# Patient Record
Sex: Female | Born: 1965 | Race: White | Hispanic: No | Marital: Married | Smoking: Never smoker
Health system: Southern US, Community
[De-identification: ages and names within clinical notes are randomized; demographics above are authoritative.]

## PROBLEM LIST (undated history)

## (undated) DIAGNOSIS — S83282A Other tear of lateral meniscus, current injury, left knee, initial encounter: Secondary | ICD-10-CM

## (undated) DIAGNOSIS — R457 State of emotional shock and stress, unspecified: Secondary | ICD-10-CM

## (undated) DIAGNOSIS — M549 Dorsalgia, unspecified: Secondary | ICD-10-CM

## (undated) DIAGNOSIS — L509 Urticaria, unspecified: Secondary | ICD-10-CM

## (undated) DIAGNOSIS — Z91018 Allergy to other foods: Secondary | ICD-10-CM

## (undated) DIAGNOSIS — Z789 Other specified health status: Secondary | ICD-10-CM

## (undated) DIAGNOSIS — T7840XA Allergy, unspecified, initial encounter: Secondary | ICD-10-CM

## (undated) DIAGNOSIS — M2550 Pain in unspecified joint: Secondary | ICD-10-CM

## (undated) HISTORY — DX: Allergy to other foods: Z91.018

## (undated) HISTORY — DX: Other tear of lateral meniscus, current injury, left knee, initial encounter: S83.282A

## (undated) HISTORY — DX: Allergy, unspecified, initial encounter: T78.40XA

## (undated) HISTORY — DX: State of emotional shock and stress, unspecified: R45.7

## (undated) HISTORY — DX: Dorsalgia, unspecified: M54.9

## (undated) HISTORY — DX: Urticaria, unspecified: L50.9

## (undated) HISTORY — DX: Pain in unspecified joint: M25.50

## (undated) HISTORY — PX: OTHER SURGICAL HISTORY: SHX169

## (undated) HISTORY — DX: Other specified health status: Z78.9

---

## 1998-07-28 ENCOUNTER — Other Ambulatory Visit: Admission: RE | Admit: 1998-07-28 | Discharge: 1998-07-28 | Payer: Self-pay | Admitting: Gynecology

## 1999-02-20 ENCOUNTER — Inpatient Hospital Stay (HOSPITAL_COMMUNITY): Admission: AD | Admit: 1999-02-20 | Discharge: 1999-02-23 | Payer: Self-pay | Admitting: Gynecology

## 1999-02-20 ENCOUNTER — Encounter (INDEPENDENT_AMBULATORY_CARE_PROVIDER_SITE_OTHER): Payer: Self-pay | Admitting: Specialist

## 1999-02-24 ENCOUNTER — Encounter: Admission: RE | Admit: 1999-02-24 | Discharge: 1999-05-25 | Payer: Self-pay | Admitting: Gynecology

## 1999-04-01 ENCOUNTER — Other Ambulatory Visit: Admission: RE | Admit: 1999-04-01 | Discharge: 1999-04-01 | Payer: Self-pay | Admitting: Gynecology

## 2000-05-02 ENCOUNTER — Other Ambulatory Visit: Admission: RE | Admit: 2000-05-02 | Discharge: 2000-05-02 | Payer: Self-pay | Admitting: Gynecology

## 2001-10-16 ENCOUNTER — Other Ambulatory Visit: Admission: RE | Admit: 2001-10-16 | Discharge: 2001-10-16 | Payer: Self-pay | Admitting: Gynecology

## 2002-06-06 ENCOUNTER — Encounter: Admission: RE | Admit: 2002-06-06 | Discharge: 2002-06-06 | Payer: Self-pay | Admitting: Gynecology

## 2002-06-06 ENCOUNTER — Encounter: Payer: Self-pay | Admitting: Gynecology

## 2002-11-17 ENCOUNTER — Other Ambulatory Visit: Admission: RE | Admit: 2002-11-17 | Discharge: 2002-11-17 | Payer: Self-pay | Admitting: Gynecology

## 2003-09-01 ENCOUNTER — Encounter: Admission: RE | Admit: 2003-09-01 | Discharge: 2003-09-01 | Payer: Self-pay | Admitting: Family Medicine

## 2004-01-14 ENCOUNTER — Other Ambulatory Visit: Admission: RE | Admit: 2004-01-14 | Discharge: 2004-01-14 | Payer: Self-pay | Admitting: Gynecology

## 2005-03-16 ENCOUNTER — Other Ambulatory Visit: Admission: RE | Admit: 2005-03-16 | Discharge: 2005-03-16 | Payer: Self-pay | Admitting: Gynecology

## 2006-03-01 ENCOUNTER — Encounter: Admission: RE | Admit: 2006-03-01 | Discharge: 2006-03-01 | Payer: Self-pay | Admitting: Gynecology

## 2006-03-19 ENCOUNTER — Other Ambulatory Visit: Admission: RE | Admit: 2006-03-19 | Discharge: 2006-03-19 | Payer: Self-pay | Admitting: Gynecology

## 2006-05-08 HISTORY — PX: GYNECOLOGIC CRYOSURGERY: SHX857

## 2007-01-11 ENCOUNTER — Other Ambulatory Visit: Admission: RE | Admit: 2007-01-11 | Discharge: 2007-01-11 | Payer: Self-pay | Admitting: Gynecology

## 2007-04-10 ENCOUNTER — Other Ambulatory Visit: Admission: RE | Admit: 2007-04-10 | Discharge: 2007-04-10 | Payer: Self-pay | Admitting: Gynecology

## 2007-05-08 ENCOUNTER — Encounter: Admission: RE | Admit: 2007-05-08 | Discharge: 2007-05-08 | Payer: Self-pay | Admitting: Gynecology

## 2007-05-15 ENCOUNTER — Encounter: Admission: RE | Admit: 2007-05-15 | Discharge: 2007-05-15 | Payer: Self-pay | Admitting: Gynecology

## 2007-11-11 ENCOUNTER — Encounter: Admission: RE | Admit: 2007-11-11 | Discharge: 2007-11-11 | Payer: Self-pay | Admitting: Gynecology

## 2007-12-25 ENCOUNTER — Other Ambulatory Visit: Admission: RE | Admit: 2007-12-25 | Discharge: 2007-12-25 | Payer: Self-pay | Admitting: Gynecology

## 2008-04-10 ENCOUNTER — Ambulatory Visit: Payer: Self-pay | Admitting: Women's Health

## 2008-04-10 ENCOUNTER — Encounter: Payer: Self-pay | Admitting: Women's Health

## 2008-04-10 ENCOUNTER — Other Ambulatory Visit: Admission: RE | Admit: 2008-04-10 | Discharge: 2008-04-10 | Payer: Self-pay | Admitting: Gynecology

## 2008-09-30 ENCOUNTER — Encounter: Admission: RE | Admit: 2008-09-30 | Discharge: 2008-09-30 | Payer: Self-pay | Admitting: Gynecology

## 2008-11-19 ENCOUNTER — Ambulatory Visit: Payer: Self-pay | Admitting: Women's Health

## 2009-01-21 ENCOUNTER — Ambulatory Visit: Payer: Self-pay | Admitting: Women's Health

## 2009-05-10 ENCOUNTER — Ambulatory Visit: Payer: Self-pay | Admitting: Women's Health

## 2009-05-10 ENCOUNTER — Other Ambulatory Visit: Admission: RE | Admit: 2009-05-10 | Discharge: 2009-05-10 | Payer: Self-pay | Admitting: Gynecology

## 2009-05-28 ENCOUNTER — Other Ambulatory Visit: Admission: RE | Admit: 2009-05-28 | Discharge: 2009-05-28 | Payer: Self-pay | Admitting: Gynecology

## 2009-05-28 ENCOUNTER — Ambulatory Visit: Payer: Self-pay | Admitting: Women's Health

## 2009-10-29 HISTORY — PX: MENISCUS REPAIR: SHX5179

## 2009-11-22 ENCOUNTER — Ambulatory Visit (HOSPITAL_BASED_OUTPATIENT_CLINIC_OR_DEPARTMENT_OTHER): Admission: RE | Admit: 2009-11-22 | Discharge: 2009-11-22 | Payer: Self-pay | Admitting: Specialist

## 2009-12-20 ENCOUNTER — Encounter: Admission: RE | Admit: 2009-12-20 | Discharge: 2009-12-20 | Payer: Self-pay | Admitting: Gynecology

## 2010-05-01 DIAGNOSIS — L509 Urticaria, unspecified: Secondary | ICD-10-CM

## 2010-05-01 HISTORY — DX: Urticaria, unspecified: L50.9

## 2010-05-11 ENCOUNTER — Ambulatory Visit
Admission: RE | Admit: 2010-05-11 | Discharge: 2010-05-11 | Payer: Self-pay | Source: Home / Self Care | Attending: Women's Health | Admitting: Women's Health

## 2010-05-11 ENCOUNTER — Other Ambulatory Visit
Admission: RE | Admit: 2010-05-11 | Discharge: 2010-05-11 | Payer: Self-pay | Source: Home / Self Care | Admitting: Gynecology

## 2010-07-16 LAB — POCT PREGNANCY, URINE

## 2010-07-16 LAB — POCT HEMOGLOBIN-HEMACUE: Hemoglobin: 12.9 g/dL (ref 12.0–15.0)

## 2010-12-30 ENCOUNTER — Other Ambulatory Visit: Payer: Self-pay | Admitting: Specialist

## 2010-12-30 ENCOUNTER — Encounter (HOSPITAL_COMMUNITY)
Admission: RE | Admit: 2010-12-30 | Discharge: 2010-12-30 | Disposition: A | Discharge status: Home / Self Care | Payer: BC Managed Care – PPO | Source: Ambulatory Visit | Attending: Specialist | Admitting: Specialist

## 2010-12-30 DIAGNOSIS — Z01812 Encounter for preprocedural laboratory examination: Secondary | ICD-10-CM | POA: Insufficient documentation

## 2010-12-30 DIAGNOSIS — Z01811 Encounter for preprocedural respiratory examination: Secondary | ICD-10-CM | POA: Insufficient documentation

## 2010-12-30 LAB — SURGICAL PCR SCREEN: Staphylococcus aureus: POSITIVE — AB

## 2011-01-05 ENCOUNTER — Ambulatory Visit (HOSPITAL_COMMUNITY): Admission: RE | Admit: 2011-01-05 | Payer: BC Managed Care – PPO | Source: Ambulatory Visit | Admitting: Specialist

## 2011-01-19 ENCOUNTER — Other Ambulatory Visit: Payer: Self-pay | Admitting: Obstetrics and Gynecology

## 2011-01-19 DIAGNOSIS — Z1231 Encounter for screening mammogram for malignant neoplasm of breast: Secondary | ICD-10-CM

## 2011-01-31 ENCOUNTER — Ambulatory Visit: Payer: BC Managed Care – PPO

## 2011-02-01 ENCOUNTER — Ambulatory Visit
Admission: RE | Admit: 2011-02-01 | Discharge: 2011-02-01 | Disposition: A | Discharge status: Home / Self Care | Payer: BC Managed Care – PPO | Source: Ambulatory Visit | Attending: Obstetrics and Gynecology | Admitting: Obstetrics and Gynecology

## 2011-02-01 DIAGNOSIS — Z1231 Encounter for screening mammogram for malignant neoplasm of breast: Secondary | ICD-10-CM

## 2011-05-23 ENCOUNTER — Other Ambulatory Visit: Payer: Self-pay | Admitting: Women's Health

## 2011-05-25 ENCOUNTER — Encounter: Payer: Self-pay | Admitting: Women's Health

## 2011-05-25 ENCOUNTER — Ambulatory Visit (INDEPENDENT_AMBULATORY_CARE_PROVIDER_SITE_OTHER): Payer: BC Managed Care – PPO | Admitting: Women's Health

## 2011-05-25 ENCOUNTER — Other Ambulatory Visit (HOSPITAL_COMMUNITY)
Admission: RE | Admit: 2011-05-25 | Discharge: 2011-05-25 | Disposition: A | Discharge status: Home / Self Care | Payer: BC Managed Care – PPO | Source: Ambulatory Visit | Attending: Obstetrics and Gynecology | Admitting: Obstetrics and Gynecology

## 2011-05-25 VITALS — BP 110/72 | Ht 63.75 in | Wt 160.0 lb

## 2011-05-25 DIAGNOSIS — Z309 Encounter for contraceptive management, unspecified: Secondary | ICD-10-CM

## 2011-05-25 DIAGNOSIS — Z01419 Encounter for gynecological examination (general) (routine) without abnormal findings: Secondary | ICD-10-CM | POA: Insufficient documentation

## 2011-05-25 DIAGNOSIS — E079 Disorder of thyroid, unspecified: Secondary | ICD-10-CM

## 2011-05-25 DIAGNOSIS — IMO0001 Reserved for inherently not codable concepts without codable children: Secondary | ICD-10-CM

## 2011-05-25 LAB — URINALYSIS, ROUTINE W REFLEX MICROSCOPIC
Bilirubin Urine: NEGATIVE
Glucose, UA: NEGATIVE mg/dL
Hgb urine dipstick: NEGATIVE
Ketones, ur: NEGATIVE mg/dL
Protein, ur: NEGATIVE mg/dL

## 2011-05-25 MED ORDER — NORETHINDRONE ACET-ETHINYL EST 1-20 MG-MCG PO TABS: 1.0000 | ORAL_TABLET | Freq: Every day | ORAL | Status: DC

## 2011-05-25 NOTE — Progress Notes (Signed)
Betty Price 17-Aug-1965 [id number]    History:    The patient presents for annual exam.  Contracepting on Loestrin 1/20 will occasionally skip a period without any missed pills. History of LGSIL/CIN-1 with cryo- in 2008 with normal Paps after. History of normal mammograms.   Past medical history, past surgical history, family history and social history were all reviewed and documented in the EPIC chart. Has had a problem with hives with unknown cause, less often this past year. Daughter Betty Price 12, doing well   ROS:  A  ROS was performed and pertinent positives and negatives are included in the history.  Exam:  Filed Vitals:   05/25/11 0919  BP: 110/72    General appearance:  Normal Head/Neck:  Normal, without cervical or supraclavicular adenopathy. Thyroid:  Symmetrical, normal in size, without palpable masses or nodularity. Respiratory  Effort:  Normal  Auscultation:  Clear without wheezing or rhonchi Cardiovascular  Auscultation:  Regular rate, without rubs, murmurs or gallops  Edema/varicosities:  Not grossly evident Abdominal  Soft,nontender, without masses, guarding or rebound.  Liver/spleen:  No organomegaly noted  Hernia:  None appreciated  Skin  Inspection:  Grossly normal  Palpation:  Grossly normal Neurologic/psychiatric  Orientation:  Normal with appropriate conversation.  Mood/affect:  Normal  Genitourinary    Breasts: Examined lying and sitting.     Right: Without masses, retractions, discharge or axillary adenopathy.     Left: Without masses, retractions, discharge or axillary adenopathy.   Inguinal/mons:  Normal without inguinal adenopathy  External genitalia:  Normal  BUS/Urethra/Skene's glands:  Normal  Bladder:  Normal  Vagina:  Normal  Cervix:  Normal  Uterus:   normal in size, shape and contour.  Midline and mobile  Adnexa/parametria:     Rt: Without masses or tenderness.   Lt: Without masses or tenderness.  Anus and perineum: Normal  Digital  rectal exam: Normal sphincter tone without palpated masses or tenderness  Assessment/Plan:  46 y.o. MWF G1 P1 for annual exam with no complaints  Normal GYN exam on Loestrin 1/20 with occasional missed cycles History of CIN-1/cryo- in 08 normal Paps after  Plan: Loestrin prescription, proper use, slight risk for blood clots and strokes reviewed. Reviewed importance of continuing pills with missed or light cycles. Reviewed importance of checking U PT if missed pills. SBEs, annual mammogram, exercise, calcium rich diet, MVI daily encouraged. CBC, TSH, UA and Pap.    Harrington Challenger [facility], 1:37 PM 05/25/2011

## 2011-05-26 LAB — CBC WITH DIFFERENTIAL/PLATELET
Basophils Absolute: 0.1 10*3/uL (ref 0.0–0.1)
Basophils Relative: 2 % — ABNORMAL HIGH (ref 0–1)
Eosinophils Absolute: 0.2 10*3/uL (ref 0.0–0.7)
Eosinophils Relative: 3 % (ref 0–5)
HCT: 39.6 % (ref 36.0–46.0)
MCHC: 33.8 g/dL (ref 30.0–36.0)
Monocytes Absolute: 0.4 10*3/uL (ref 0.1–1.0)
Neutro Abs: 3 10*3/uL (ref 1.7–7.7)
RDW: 13.2 % (ref 11.5–15.5)

## 2011-10-02 ENCOUNTER — Telehealth: Payer: Self-pay | Admitting: *Deleted

## 2011-10-02 DIAGNOSIS — Z8349 Family history of other endocrine, nutritional and metabolic diseases: Secondary | ICD-10-CM

## 2011-10-02 NOTE — Telephone Encounter (Signed)
Ok please call pt.,  have her make lab appointment for Thyroid panel.

## 2011-10-02 NOTE — Telephone Encounter (Signed)
Pt called requesting if she could have a full thyroid panel lab drawn, her sister has had thyroid problems. Call back number if needed [phone]. Please advise

## 2011-10-03 NOTE — Telephone Encounter (Signed)
Left message on pt voicemail with the below, orders placed.

## 2011-10-04 ENCOUNTER — Other Ambulatory Visit: Payer: BC Managed Care – PPO

## 2011-10-04 DIAGNOSIS — Z8349 Family history of other endocrine, nutritional and metabolic diseases: Secondary | ICD-10-CM

## 2011-10-05 LAB — THYROID ANTIBODIES
Thyroglobulin Ab: <20 U/mL (ref <40.0)
Thyroperoxidase Ab SerPl-aCnc: <10 IU/mL (ref <35.0)

## 2012-03-25 ENCOUNTER — Other Ambulatory Visit: Payer: Self-pay | Admitting: Obstetrics and Gynecology

## 2012-03-25 DIAGNOSIS — Z1231 Encounter for screening mammogram for malignant neoplasm of breast: Secondary | ICD-10-CM

## 2012-03-27 ENCOUNTER — Ambulatory Visit
Admission: RE | Admit: 2012-03-27 | Discharge: 2012-03-27 | Disposition: A | Discharge status: Home / Self Care | Payer: BC Managed Care – PPO | Source: Ambulatory Visit | Attending: Obstetrics and Gynecology | Admitting: Obstetrics and Gynecology

## 2012-03-27 DIAGNOSIS — Z1231 Encounter for screening mammogram for malignant neoplasm of breast: Secondary | ICD-10-CM

## 2012-05-29 ENCOUNTER — Encounter: Payer: BC Managed Care – PPO | Admitting: Women's Health

## 2012-06-10 ENCOUNTER — Ambulatory Visit (INDEPENDENT_AMBULATORY_CARE_PROVIDER_SITE_OTHER): Payer: BC Managed Care – PPO | Admitting: Women's Health

## 2012-06-10 ENCOUNTER — Encounter: Payer: Self-pay | Admitting: Women's Health

## 2012-06-10 VITALS — BP 110/62 | Ht 64.0 in | Wt 157.0 lb

## 2012-06-10 DIAGNOSIS — Z01419 Encounter for gynecological examination (general) (routine) without abnormal findings: Secondary | ICD-10-CM

## 2012-06-10 DIAGNOSIS — Z13220 Encounter for screening for lipoid disorders: Secondary | ICD-10-CM

## 2012-06-10 DIAGNOSIS — N879 Dysplasia of cervix uteri, unspecified: Secondary | ICD-10-CM

## 2012-06-10 DIAGNOSIS — E079 Disorder of thyroid, unspecified: Secondary | ICD-10-CM

## 2012-06-10 DIAGNOSIS — Z833 Family history of diabetes mellitus: Secondary | ICD-10-CM

## 2012-06-10 DIAGNOSIS — IMO0001 Reserved for inherently not codable concepts without codable children: Secondary | ICD-10-CM

## 2012-06-10 DIAGNOSIS — Z309 Encounter for contraceptive management, unspecified: Secondary | ICD-10-CM

## 2012-06-10 LAB — CBC WITH DIFFERENTIAL/PLATELET
Hemoglobin: 13.1 g/dL (ref 12.0–15.0)
Lymphs Abs: 1.7 10*3/uL (ref 0.7–4.0)
Monocytes Relative: 6 % (ref 3–12)
Neutro Abs: 4 10*3/uL (ref 1.7–7.7)
Neutrophils Relative %: 63 % (ref 43–77)
Platelets: 335 10*3/uL (ref 150–400)
RBC: 4.09 MIL/uL (ref 3.87–5.11)
WBC: 6.3 10*3/uL (ref 4.0–10.5)

## 2012-06-10 LAB — LIPID PANEL
HDL: 67 mg/dL (ref >39)
Total CHOL/HDL Ratio: 2.8 Ratio
Triglycerides: 99 mg/dL (ref <150)

## 2012-06-10 LAB — GLUCOSE, RANDOM: Glucose, Bld: 107 mg/dL — ABNORMAL HIGH (ref 70–99)

## 2012-06-10 LAB — TSH: TSH: 2.663 u[IU]/mL (ref 0.350–4.500)

## 2012-06-10 MED ORDER — NORETHINDRONE ACET-ETHINYL EST 1-20 MG-MCG PO TABS: 1.0000 | ORAL_TABLET | Freq: Every day | ORAL | Status: DC

## 2012-06-10 NOTE — Progress Notes (Signed)
Betty Price March 15, 1966 [id number]    History:    The patient presents for annual exam.  Light cycle on Loestrin 1/20. History of normal mammograms, LGSIL/CIN-1 2008 cryo- normal Paps after. Had problem with hives of unknown origin on gluten free diet with resolution of hives.   Past medical history, past surgical history, family history and social history were all reviewed and documented in the EPIC chart .Betty Price  13 doing well, gardasil series completed. Plays tennis. Sister with thyroid disorder. Father with hypertension skin cancer basal, died of  MI.   ROS:  A  ROS was performed and pertinent positives and negatives are included in the history.  Exam:  Filed Vitals:   06/10/12 0909  BP: 110/62    General appearance:  Normal Head/Neck:  Normal, without cervical or supraclavicular adenopathy. Thyroid:  Symmetrical, normal in size, without palpable masses or nodularity. Respiratory  Effort:  Normal  Auscultation:  Clear without wheezing or rhonchi Cardiovascular  Auscultation:  Regular rate, without rubs, murmurs or gallops  Edema/varicosities:  Not grossly evident Abdominal  Soft,nontender, without masses, guarding or rebound.  Liver/spleen:  No organomegaly noted  Hernia:  None appreciated  Skin  Inspection:  Grossly normal  Palpation:  Grossly normal Neurologic/psychiatric  Orientation:  Normal with appropriate conversation.  Mood/affect:  Normal  Genitourinary    Breasts: Examined lying and sitting.     Right: Without masses, retractions, discharge or axillary adenopathy.     Left: Without masses, retractions, discharge or axillary adenopathy.   Inguinal/mons:  Normal without inguinal adenopathy  External genitalia:  Normal  BUS/Urethra/Skene's glands:  Normal  Bladder:  Normal  Vagina:  Normal  Cervix:  Normal  Uterus:  normal in size, shape and contour.  Midline and mobile  Adnexa/parametria:     Rt: Without masses or tenderness.   Lt: Without masses or  tenderness.  Anus and perineum: Normal  Digital rectal exam: Normal sphincter tone without palpated masses or tenderness  Assessment/Plan:  47 y.o. M. WF G1P1 for annual exam with no complaints.    LGSIL/CIN-1/cryo- 2008 normal Paps after Loestrin 1/20/ light cycles  Plan: SBE's, continue annual mammogram, calcium rich diet,  vitamin D 1000 daily encouraged. Loestrin 1/20 prescription, use, slight risk for blood clots and strokes reviewed. CBC, glucose, lipid panel, TSH, UA, Pap normal 2013, new Pap screening guidelines reviewed.    Harrington Challenger [facility], 10:31 AM 06/10/2012

## 2012-06-10 NOTE — Patient Instructions (Signed)

## 2012-06-11 ENCOUNTER — Encounter: Payer: Self-pay | Admitting: Women's Health

## 2012-06-11 LAB — URINALYSIS W MICROSCOPIC + REFLEX CULTURE
Casts: NONE SEEN
Hgb urine dipstick: NEGATIVE
Leukocytes, UA: NEGATIVE
Nitrite: NEGATIVE
Specific Gravity, Urine: 1.02 (ref 1.005–1.030)
Urobilinogen, UA: 0.2 mg/dL (ref 0.0–1.0)
pH: 5.5 (ref 5.0–8.0)

## 2012-06-17 ENCOUNTER — Other Ambulatory Visit: Payer: Self-pay | Admitting: Women's Health

## 2012-06-17 DIAGNOSIS — R7309 Other abnormal glucose: Secondary | ICD-10-CM

## 2012-06-21 ENCOUNTER — Other Ambulatory Visit: Payer: BC Managed Care – PPO

## 2013-04-14 ENCOUNTER — Other Ambulatory Visit: Payer: Self-pay

## 2013-04-14 DIAGNOSIS — Z1231 Encounter for screening mammogram for malignant neoplasm of breast: Secondary | ICD-10-CM

## 2013-05-21 ENCOUNTER — Ambulatory Visit
Admission: RE | Admit: 2013-05-21 | Discharge: 2013-05-21 | Disposition: A | Discharge status: Home / Self Care | Payer: BC Managed Care – PPO | Source: Ambulatory Visit

## 2013-05-21 DIAGNOSIS — Z1231 Encounter for screening mammogram for malignant neoplasm of breast: Secondary | ICD-10-CM

## 2013-06-11 ENCOUNTER — Encounter: Payer: BC Managed Care – PPO | Admitting: Women's Health

## 2013-06-12 ENCOUNTER — Telehealth: Payer: Self-pay | Admitting: *Deleted

## 2013-06-12 NOTE — Telephone Encounter (Signed)
Pt called and said birth control pills were never sent to pharmacy on 06/10/13 OV, I called pharmacy and asked if they could please fill Rx for pt.

## 2013-06-13 ENCOUNTER — Other Ambulatory Visit: Payer: Self-pay

## 2013-06-13 DIAGNOSIS — IMO0001 Reserved for inherently not codable concepts without codable children: Secondary | ICD-10-CM

## 2013-06-13 MED ORDER — NORETHINDRONE ACET-ETHINYL EST 1-20 MG-MCG PO TABS: 1.0000 | ORAL_TABLET | Freq: Every day | ORAL | Status: DC

## 2013-07-01 ENCOUNTER — Ambulatory Visit (INDEPENDENT_AMBULATORY_CARE_PROVIDER_SITE_OTHER): Payer: BC Managed Care – PPO | Admitting: Women's Health

## 2013-07-01 ENCOUNTER — Encounter: Payer: Self-pay | Admitting: Women's Health

## 2013-07-01 ENCOUNTER — Other Ambulatory Visit (HOSPITAL_COMMUNITY)
Admission: RE | Admit: 2013-07-01 | Discharge: 2013-07-01 | Disposition: A | Discharge status: Home / Self Care | Payer: BC Managed Care – PPO | Source: Ambulatory Visit | Attending: Gynecology | Admitting: Gynecology

## 2013-07-01 VITALS — BP 116/74 | Ht 63.75 in | Wt 158.6 lb

## 2013-07-01 DIAGNOSIS — Z309 Encounter for contraceptive management, unspecified: Secondary | ICD-10-CM

## 2013-07-01 DIAGNOSIS — Z833 Family history of diabetes mellitus: Secondary | ICD-10-CM

## 2013-07-01 DIAGNOSIS — Z01419 Encounter for gynecological examination (general) (routine) without abnormal findings: Secondary | ICD-10-CM

## 2013-07-01 DIAGNOSIS — IMO0001 Reserved for inherently not codable concepts without codable children: Secondary | ICD-10-CM

## 2013-07-01 LAB — CBC WITH DIFFERENTIAL/PLATELET
BASOS ABS: 0.1 10*3/uL (ref 0.0–0.1)
BASOS PCT: 1 % (ref 0–1)
EOS ABS: 0.1 10*3/uL (ref 0.0–0.7)
Eosinophils Relative: 2 % (ref 0–5)
HCT: 40.4 % (ref 36.0–46.0)
HEMOGLOBIN: 13.9 g/dL (ref 12.0–15.0)
Lymphocytes Relative: 27 % (ref 12–46)
Lymphs Abs: 1.7 10*3/uL (ref 0.7–4.0)
MCH: 32.9 pg (ref 26.0–34.0)
MCHC: 34.4 g/dL (ref 30.0–36.0)
MCV: 95.5 fL (ref 78.0–100.0)
MONOS PCT: 6 % (ref 3–12)
Monocytes Absolute: 0.4 10*3/uL (ref 0.1–1.0)
NEUTROS ABS: 4.1 10*3/uL (ref 1.7–7.7)
NEUTROS PCT: 64 % (ref 43–77)
PLATELETS: 336 10*3/uL (ref 150–400)
RBC: 4.23 MIL/uL (ref 3.87–5.11)
RDW: 12.9 % (ref 11.5–15.5)
WBC: 6.4 10*3/uL (ref 4.0–10.5)

## 2013-07-01 MED ORDER — NORETHINDRONE ACET-ETHINYL EST 1-20 MG-MCG PO TABS: 1.0000 | ORAL_TABLET | Freq: Every day | ORAL | Status: DC

## 2013-07-01 NOTE — Progress Notes (Signed)
Erven CollaJill B Aronov [birth year] [id number]    History:    Presents for annual exam.  Regular monthly cycle on Loestrin 1/20 with no complaints. 2008 cryo- for LGSIL with normal Paps after. Normal mammogram history. Had a problem with hives currently on a gluten free diet which has helped.Glucose 109 last year.  Past medical history, past surgical history, family history and social history were all reviewed and documented in the EPIC chart. [name] 14 doing well has had gardasil. Plays tennis. Father hypertension and heart disease died in his 8750s.  ROS:  A  ROS was performed and pertinent positives and negatives are included.  Exam:  Filed Vitals:   07/01/13 0931  BP: 116/74    General appearance:  Normal Thyroid:  Symmetrical, normal in size, without palpable masses or nodularity. Respiratory  Auscultation:  Clear without wheezing or rhonchi Cardiovascular  Auscultation:  Regular rate, without rubs, murmurs or gallops  Edema/varicosities:  Not grossly evident Abdominal  Soft,nontender, without masses, guarding or rebound.  Liver/spleen:  No organomegaly noted  Hernia:  None appreciated  Skin  Inspection:  Grossly normal   Breasts: Examined lying and sitting.     Right: Without masses, retractions, discharge or axillary adenopathy.     Left: Without masses, retractions, discharge or axillary adenopathy. Gentitourinary   Inguinal/mons:  Normal without inguinal adenopathy  External genitalia:  Normal  BUS/Urethra/Skene's glands:  Normal  Vagina:  Normal  Cervix:  Normal  Uterus:   normal in size, shape and contour.  Midline and mobile  Adnexa/parametria:     Rt: Without masses or tenderness.   Lt: Without masses or tenderness.  Anus and perineum: Normal  Digital rectal exam: Normal sphincter tone without palpated masses or tenderness  Assessment/Plan:  48 y.o.MWF G1P1  for annual exam with no complaints.  2008 cryo- for LGSIL normal Paps after  Plan: Loestrin 1/20 prescription,  proper use, slight risk for blood clots and strokes reviewed. SBE's, continue annual mammogram, 3-D tomography history of dense breast. Continue regular exercise, healthy diet, vitamin D 1000 daily encouraged. CBC, hemoglobin A1c, UA, Pap. Pap normal 05/2011, new screening guidelines reviewed.    Aubreyanna Dorrough J WHNP, 1:40 PM 07/01/2013

## 2013-07-01 NOTE — Patient Instructions (Signed)

## 2013-07-02 ENCOUNTER — Encounter: Payer: Self-pay | Admitting: Women's Health

## 2013-07-02 LAB — URINALYSIS W MICROSCOPIC + REFLEX CULTURE
Bacteria, UA: NONE SEEN
Bilirubin Urine: NEGATIVE
Casts: NONE SEEN
Crystals: NONE SEEN
GLUCOSE, UA: NEGATIVE mg/dL
HGB URINE DIPSTICK: NEGATIVE
Ketones, ur: NEGATIVE mg/dL
LEUKOCYTES UA: NEGATIVE
NITRITE: NEGATIVE
PROTEIN: NEGATIVE mg/dL
SQUAMOUS EPITHELIAL / LPF: NONE SEEN
Specific Gravity, Urine: 1.01 (ref 1.005–1.030)
UROBILINOGEN UA: 0.2 mg/dL (ref 0.0–1.0)
pH: 6.5 (ref 5.0–8.0)

## 2013-07-02 LAB — HEMOGLOBIN A1C
HEMOGLOBIN A1C: 5.4 % (ref <5.7)
MEAN PLASMA GLUCOSE: 108 mg/dL (ref <117)

## 2014-03-02 ENCOUNTER — Encounter: Payer: Self-pay | Admitting: Women's Health

## 2014-07-03 ENCOUNTER — Encounter: Payer: BC Managed Care – PPO | Admitting: Women's Health

## 2014-07-09 ENCOUNTER — Encounter: Payer: Self-pay | Admitting: Women's Health

## 2014-07-21 ENCOUNTER — Other Ambulatory Visit (HOSPITAL_COMMUNITY)
Admission: RE | Admit: 2014-07-21 | Discharge: 2014-07-21 | Disposition: A | Discharge status: Home / Self Care | Payer: BLUE CROSS/BLUE SHIELD | Source: Ambulatory Visit | Attending: Women's Health | Admitting: Women's Health

## 2014-07-21 ENCOUNTER — Other Ambulatory Visit: Payer: Self-pay

## 2014-07-21 ENCOUNTER — Other Ambulatory Visit: Payer: Self-pay | Admitting: Women's Health

## 2014-07-21 ENCOUNTER — Ambulatory Visit (INDEPENDENT_AMBULATORY_CARE_PROVIDER_SITE_OTHER): Payer: BLUE CROSS/BLUE SHIELD | Admitting: Women's Health

## 2014-07-21 ENCOUNTER — Encounter: Payer: Self-pay | Admitting: Women's Health

## 2014-07-21 VITALS — BP 126/80 | Ht 63.0 in | Wt 160.0 lb

## 2014-07-21 DIAGNOSIS — Z01419 Encounter for gynecological examination (general) (routine) without abnormal findings: Secondary | ICD-10-CM | POA: Insufficient documentation

## 2014-07-21 DIAGNOSIS — Z3041 Encounter for surveillance of contraceptive pills: Secondary | ICD-10-CM

## 2014-07-21 DIAGNOSIS — Z1151 Encounter for screening for human papillomavirus (HPV): Secondary | ICD-10-CM | POA: Diagnosis present

## 2014-07-21 DIAGNOSIS — Z13220 Encounter for screening for lipoid disorders: Secondary | ICD-10-CM | POA: Diagnosis not present

## 2014-07-21 DIAGNOSIS — Z1239 Encounter for other screening for malignant neoplasm of breast: Secondary | ICD-10-CM

## 2014-07-21 LAB — COMPREHENSIVE METABOLIC PANEL
ALBUMIN: 4 g/dL (ref 3.5–5.2)
ALT: 24 U/L (ref 0–35)
AST: 20 U/L (ref 0–37)
Alkaline Phosphatase: 38 U/L — ABNORMAL LOW (ref 39–117)
BUN: 13 mg/dL (ref 6–23)
CO2: 25 mEq/L (ref 19–32)
Calcium: 9.1 mg/dL (ref 8.4–10.5)
Chloride: 104 mEq/L (ref 96–112)
Creat: 0.87 mg/dL (ref 0.50–1.10)
GLUCOSE: 142 mg/dL — AB (ref 70–99)
Potassium: 4.9 mEq/L (ref 3.5–5.3)
Sodium: 137 mEq/L (ref 135–145)
TOTAL PROTEIN: 6.3 g/dL (ref 6.0–8.3)
Total Bilirubin: 0.6 mg/dL (ref 0.2–1.2)

## 2014-07-21 LAB — CBC WITH DIFFERENTIAL/PLATELET
Basophils Absolute: 0.1 10*3/uL (ref 0.0–0.1)
Basophils Relative: 1 % (ref 0–1)
EOS ABS: 0.5 10*3/uL (ref 0.0–0.7)
Eosinophils Relative: 8 % — ABNORMAL HIGH (ref 0–5)
HEMATOCRIT: 38.9 % (ref 36.0–46.0)
Hemoglobin: 12.8 g/dL (ref 12.0–15.0)
LYMPHS ABS: 1.7 10*3/uL (ref 0.7–4.0)
LYMPHS PCT: 25 % (ref 12–46)
MCH: 31.1 pg (ref 26.0–34.0)
MCHC: 32.9 g/dL (ref 30.0–36.0)
MCV: 94.6 fL (ref 78.0–100.0)
MONOS PCT: 8 % (ref 3–12)
MPV: 9.9 fL (ref 8.6–12.4)
Monocytes Absolute: 0.5 10*3/uL (ref 0.1–1.0)
NEUTROS ABS: 3.8 10*3/uL (ref 1.7–7.7)
Neutrophils Relative %: 58 % (ref 43–77)
Platelets: 346 10*3/uL (ref 150–400)
RBC: 4.11 MIL/uL (ref 3.87–5.11)
RDW: 13.1 % (ref 11.5–15.5)
WBC: 6.6 10*3/uL (ref 4.0–10.5)

## 2014-07-21 LAB — LIPID PANEL
Cholesterol: 191 mg/dL (ref 0–200)
HDL: 60 mg/dL (ref >=46)
LDL CALC: 109 mg/dL — AB (ref 0–99)
Total CHOL/HDL Ratio: 3.2 Ratio
Triglycerides: 108 mg/dL (ref <150)
VLDL: 22 mg/dL (ref 0–40)

## 2014-07-21 MED ORDER — NORETHINDRONE ACET-ETHINYL EST 1-20 MG-MCG PO TABS: 1.0000 | ORAL_TABLET | Freq: Every day | ORAL | Status: DC

## 2014-07-21 NOTE — Patient Instructions (Signed)

## 2014-07-21 NOTE — Progress Notes (Signed)
Betty Price Apr 29, 1966 [id number]    History:    Presents for annual exam.  Light monthly cycle on Loestrin. Normal mammogram history. 2008 cryo-with normal Paps after. Had had problems with hives currently on a gluten-free diet with resolution of hives.  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 Father heart disease and hypertension.  ROS:  A ROS was performed and pertinent positives and negatives are included.  Exam:  Filed Vitals:   07/21/14 1003  BP: 126/80    General appearance:  Normal Thyroid:  Symmetrical, normal in size, without palpable masses or nodularity. Respiratory  Auscultation:  Clear without wheezing or rhonchi Cardiovascular  Auscultation:  Regular rate, without rubs, murmurs or gallops  Edema/varicosities:  Not grossly evident Abdominal  Soft,nontender, without masses, guarding or rebound.  Liver/spleen:  No organomegaly noted  Hernia:  None appreciated  Skin  Inspection:  Grossly normal   Breasts: Examined lying and sitting.     Right: Without masses, retractions, discharge or axillary adenopathy.     Left: Without masses, retractions, discharge or axillary adenopathy. Gentitourinary   Inguinal/mons:  Normal without inguinal adenopathy  External genitalia:  Normal  BUS/Urethra/Skene's glands:  Normal  Vagina:  Normal  Cervix:  Normal  Uterus:   normal in size, shape and contour.  Midline and mobile  Adnexa/parametria:     Rt: Without masses or tenderness.   Lt: Without masses or tenderness.  Anus and perineum: Normal  Digital rectal exam: Normal sphincter tone without palpated masses or tenderness  Assessment/Plan:  49 y.o. MWF G1P1 for annual exam with no complaints.  Light monthly cycle on Loestrin 2008 cryo-with normal Paps after  Plan: Loestrin 1/20 prescription, proper use, slight risk for blood clots and strokes reviewed.  SBE's, continue annual 3-D mammogram, breast tissue dense. Continue regular exercise, tenderness, calcium rich diet, vitamin D 1000 daily encouraged. CBC, lipid panel, CMP, vitamin D, TSH, UA, Pap with HR HPV typing, new screening guidelines reviewed.  Betty Price,Betty Price J WHNP, 1:14 PM 07/21/2014

## 2014-07-22 LAB — URINALYSIS W MICROSCOPIC + REFLEX CULTURE
BILIRUBIN URINE: NEGATIVE
CRYSTALS: NONE SEEN
Casts: NONE SEEN
GLUCOSE, UA: NEGATIVE mg/dL
Hgb urine dipstick: NEGATIVE
Ketones, ur: NEGATIVE mg/dL
LEUKOCYTES UA: NEGATIVE
Nitrite: NEGATIVE
Protein, ur: NEGATIVE mg/dL
SPECIFIC GRAVITY, URINE: 1.01 (ref 1.005–1.030)
UROBILINOGEN UA: 0.2 mg/dL (ref 0.0–1.0)
pH: 6 (ref 5.0–8.0)

## 2014-07-22 LAB — HEMOGLOBIN A1C
HEMOGLOBIN A1C: 5.6 % (ref <5.7)
Mean Plasma Glucose: 114 mg/dL (ref <117)

## 2014-07-22 LAB — VITAMIN D 25 HYDROXY (VIT D DEFICIENCY, FRACTURES): VIT D 25 HYDROXY: 33 ng/mL (ref 30–100)

## 2014-07-23 ENCOUNTER — Other Ambulatory Visit: Payer: Self-pay | Admitting: *Deleted

## 2014-07-23 DIAGNOSIS — R739 Hyperglycemia, unspecified: Secondary | ICD-10-CM

## 2014-07-23 LAB — URINE CULTURE

## 2014-07-23 LAB — CYTOLOGY - PAP

## 2014-07-24 ENCOUNTER — Encounter: Payer: Self-pay | Admitting: Women's Health

## 2014-08-10 ENCOUNTER — Encounter: Payer: Self-pay | Admitting: Women's Health

## 2014-08-10 ENCOUNTER — Ambulatory Visit
Admission: RE | Admit: 2014-08-10 | Discharge: 2014-08-10 | Disposition: A | Discharge status: Home / Self Care | Payer: BLUE CROSS/BLUE SHIELD | Source: Ambulatory Visit

## 2014-08-10 DIAGNOSIS — Z1239 Encounter for other screening for malignant neoplasm of breast: Secondary | ICD-10-CM

## 2015-04-29 ENCOUNTER — Telehealth: Payer: Self-pay | Admitting: *Deleted

## 2015-04-29 DIAGNOSIS — N912 Amenorrhea, unspecified: Secondary | ICD-10-CM

## 2015-04-29 NOTE — Telephone Encounter (Signed)
Best to check a FSH prior to starting back on OCs. Have her schedule lab appointment.

## 2015-04-29 NOTE — Telephone Encounter (Signed)
Pt aware order placed, will come tomorrow at 9:00am

## 2015-04-29 NOTE — Telephone Encounter (Signed)
Please call, Condoms in the meanwhile and first month back on, start first day of her next cycle. Have her call if cycle does not start. Will most likely start soon since she has been off OCs for 8 days.

## 2015-04-29 NOTE — Telephone Encounter (Signed)
Pt informed with the below note, pt said has not had cycle greater than 1 year.

## 2015-04-29 NOTE — Telephone Encounter (Signed)
Pt called left out of town without her birth control pills, now has been off for about 8 days now. Pt asked how should she proceed with restarting her pills? Please advise

## 2015-04-30 ENCOUNTER — Other Ambulatory Visit (INDEPENDENT_AMBULATORY_CARE_PROVIDER_SITE_OTHER): Payer: BLUE CROSS/BLUE SHIELD

## 2015-04-30 DIAGNOSIS — Z23 Encounter for immunization: Secondary | ICD-10-CM | POA: Diagnosis not present

## 2015-04-30 DIAGNOSIS — N912 Amenorrhea, unspecified: Secondary | ICD-10-CM

## 2015-04-30 LAB — FOLLICLE STIMULATING HORMONE: FSH: 41.8 m[IU]/mL

## 2015-05-03 ENCOUNTER — Telehealth: Payer: Self-pay

## 2015-05-03 NOTE — Telephone Encounter (Signed)
Patient wants to know how to get back on her OC's. She had never intended to stop them. She said she does not want to use condoms and OC's a lot easier.

## 2015-05-03 NOTE — Telephone Encounter (Signed)
Patient advised.

## 2015-05-03 NOTE — Telephone Encounter (Signed)
Patient called in voice mail. Said to tell you [facility]. She said to tell you yes she has had menopause symptoms for several mos. Nightsweats and hotflashes occasionally, weight gain and trouble staying asleep at night.  She believe her last period was last January 2016.  (She is scheduled 07/22/15 to see you for yearly exam.)  She said that she forgot to take her pills on vacation and that is what prompted all of this. She said she needs to know what to do now in regards to her pills and restarting them.

## 2015-05-03 NOTE — Telephone Encounter (Signed)
Start new pack on Sunday, use withdrawal/condoms  first month back on. Will continue for 1 more year and then stop.

## 2015-05-03 NOTE — Telephone Encounter (Signed)
Can continue to just watch for now, condoms if sexually active, best to use contraception for one year after last cycle which is about now. If menopausal symptoms become too bothersome have her call or we can discuss at annual exam.

## 2015-07-08 ENCOUNTER — Other Ambulatory Visit: Payer: Self-pay

## 2015-07-08 DIAGNOSIS — Z1231 Encounter for screening mammogram for malignant neoplasm of breast: Secondary | ICD-10-CM

## 2015-07-22 ENCOUNTER — Ambulatory Visit (INDEPENDENT_AMBULATORY_CARE_PROVIDER_SITE_OTHER): Payer: BLUE CROSS/BLUE SHIELD | Admitting: Women's Health

## 2015-07-22 ENCOUNTER — Encounter: Payer: Self-pay | Admitting: Women's Health

## 2015-07-22 VITALS — BP 132/78 | Ht 63.0 in | Wt 169.0 lb

## 2015-07-22 DIAGNOSIS — Z01419 Encounter for gynecological examination (general) (routine) without abnormal findings: Secondary | ICD-10-CM

## 2015-07-22 DIAGNOSIS — Z13220 Encounter for screening for lipoid disorders: Secondary | ICD-10-CM

## 2015-07-22 DIAGNOSIS — Z833 Family history of diabetes mellitus: Secondary | ICD-10-CM | POA: Diagnosis not present

## 2015-07-22 DIAGNOSIS — N951 Menopausal and female climacteric states: Secondary | ICD-10-CM | POA: Diagnosis not present

## 2015-07-22 DIAGNOSIS — Z3041 Encounter for surveillance of contraceptive pills: Secondary | ICD-10-CM | POA: Diagnosis not present

## 2015-07-22 MED ORDER — NORETHINDRONE ACET-ETHINYL EST 1-20 MG-MCG PO TABS: 1.0000 | ORAL_TABLET | Freq: Every day | ORAL | Status: DC

## 2015-07-22 NOTE — Progress Notes (Signed)
Erven CollaJill B Abeln February 11, 1966 [id number]    History:    Presents for annual exam.  Amenorrheic on Loestrin, menopausal symptoms, 04/2015 FSH 41. 2008 cryo-with normal Paps after. Had a problem with hives 2012, now eats gluten-free with resolution of hives. Normal mammogram history.  Past medical history, past surgical history, family history and social history were all reviewed and documented in the EPIC chart. [name] 16 doing well. Father heart disease, hypertension.  ROS:  A ROS was performed and pertinent positives and negatives are included.  Exam:  Filed Vitals:   07/22/15 0816  BP: 132/78    General appearance:  Normal Thyroid:  Symmetrical, normal in size, without palpable masses or nodularity. Respiratory  Auscultation:  Clear without wheezing or rhonchi Cardiovascular  Auscultation:  Regular rate, without rubs, murmurs or gallops  Edema/varicosities:  Not grossly evident Abdominal  Soft,nontender, without masses, guarding or rebound.  Liver/spleen:  No organomegaly noted  Hernia:  None appreciated  Skin  Inspection:  Grossly normal   Breasts: Examined lying and sitting.     Right: Without masses, retractions, discharge or axillary adenopathy.     Left: Without masses, retractions, discharge or axillary adenopathy. Gentitourinary   Inguinal/mons:  Normal without inguinal adenopathy  External genitalia:  Normal  BUS/Urethra/Skene's glands:  Normal  Vagina:  Normal  Cervix:  Normal  Uterus:   normal in size, shape and contour.  Midline and mobile  Adnexa/parametria:     Rt: Without masses or tenderness.   Lt: Without masses or tenderness.  Anus and perineum: Normal  Digital rectal exam: Normal sphincter tone without palpated masses or tenderness  Assessment/Plan:  50 y.o. MWF G1P1 for annual exam with complaint of difficulty losing weight.  Amenorrheic on Loestrin 2008 LGSIL cryo-, normal Paps after  Plan: FSH if continued elevated will stop Loestrin. SBE's, continue  annual screening mammogram, exercise, calcium rich diet, decrease carbs, vitamin D 2000 daily encouraged. CBC, hemoglobin A1c, lipid panel, FSH, UA, Pap normal with negative HR HPV 2016, new screening guidelines reviewed.    Harrington ChallengerYOUNG,NANCY J [facility] BedfordWHNP, 12:09 PM 07/22/2015

## 2015-07-22 NOTE — Patient Instructions (Signed)

## 2015-07-23 ENCOUNTER — Other Ambulatory Visit: Payer: BLUE CROSS/BLUE SHIELD

## 2015-07-23 LAB — URINALYSIS W MICROSCOPIC + REFLEX CULTURE
Bilirubin Urine: NEGATIVE
Casts: NONE SEEN [LPF]
Crystals: NONE SEEN [HPF]
GLUCOSE, UA: NEGATIVE
Hgb urine dipstick: NEGATIVE
Ketones, ur: NEGATIVE
LEUKOCYTES UA: NEGATIVE
NITRITE: NEGATIVE
PROTEIN: NEGATIVE
Specific Gravity, Urine: 1.022 (ref 1.001–1.035)
Yeast: NONE SEEN [HPF]
pH: 6 (ref 5.0–8.0)

## 2015-07-23 LAB — LIPID PANEL
Cholesterol: 183 mg/dL (ref 125–200)
HDL: 56 mg/dL (ref >=46)
LDL CALC: 108 mg/dL (ref <130)
TRIGLYCERIDES: 93 mg/dL (ref <150)
Total CHOL/HDL Ratio: 3.3 Ratio (ref <=5.0)
VLDL: 19 mg/dL (ref <30)

## 2015-07-23 LAB — CBC WITH DIFFERENTIAL/PLATELET
BASOS ABS: 0 10*3/uL (ref 0.0–0.1)
BASOS PCT: 1 % (ref 0–1)
Eosinophils Absolute: 0.1 10*3/uL (ref 0.0–0.7)
Eosinophils Relative: 3 % (ref 0–5)
HCT: 38.8 % (ref 36.0–46.0)
Hemoglobin: 13 g/dL (ref 12.0–15.0)
LYMPHS ABS: 1.8 10*3/uL (ref 0.7–4.0)
Lymphocytes Relative: 38 % (ref 12–46)
MCH: 32.3 pg (ref 26.0–34.0)
MCHC: 33.5 g/dL (ref 30.0–36.0)
MCV: 96.3 fL (ref 78.0–100.0)
MONOS PCT: 9 % (ref 3–12)
MPV: 9.8 fL (ref 8.6–12.4)
Monocytes Absolute: 0.4 10*3/uL (ref 0.1–1.0)
NEUTROS PCT: 49 % (ref 43–77)
Neutro Abs: 2.4 10*3/uL (ref 1.7–7.7)
Platelets: 318 10*3/uL (ref 150–400)
RBC: 4.03 MIL/uL (ref 3.87–5.11)
RDW: 12.9 % (ref 11.5–15.5)
WBC: 4.8 10*3/uL (ref 4.0–10.5)

## 2015-07-23 LAB — HEMOGLOBIN A1C
Hgb A1c MFr Bld: 5.4 % (ref <5.7)
MEAN PLASMA GLUCOSE: 108 mg/dL (ref <117)

## 2015-07-23 LAB — FOLLICLE STIMULATING HORMONE: FSH: 3.8 m[IU]/mL

## 2015-07-25 LAB — URINE CULTURE: Colony Count: >=100000

## 2015-07-28 ENCOUNTER — Telehealth: Payer: Self-pay | Admitting: *Deleted

## 2015-07-28 MED ORDER — CIPROFLOXACIN HCL 250 MG PO TABS: 250.0000 mg | ORAL_TABLET | Freq: Two times a day (BID) | ORAL | Status: DC

## 2015-07-28 NOTE — Telephone Encounter (Signed)
Pt called was informed she has UTI 07/26/15 result note Rx was never sent. Rx will be sent today. Pt aware

## 2015-08-20 ENCOUNTER — Ambulatory Visit
Admission: RE | Admit: 2015-08-20 | Discharge: 2015-08-20 | Disposition: A | Discharge status: Home / Self Care | Payer: BLUE CROSS/BLUE SHIELD | Source: Ambulatory Visit

## 2015-08-20 DIAGNOSIS — Z1231 Encounter for screening mammogram for malignant neoplasm of breast: Secondary | ICD-10-CM

## 2016-07-05 ENCOUNTER — Other Ambulatory Visit: Payer: Self-pay | Admitting: Women's Health

## 2016-07-05 DIAGNOSIS — Z1231 Encounter for screening mammogram for malignant neoplasm of breast: Secondary | ICD-10-CM

## 2016-07-27 ENCOUNTER — Encounter: Payer: Self-pay | Admitting: Allergy

## 2016-07-27 ENCOUNTER — Ambulatory Visit (INDEPENDENT_AMBULATORY_CARE_PROVIDER_SITE_OTHER): Payer: BLUE CROSS/BLUE SHIELD | Admitting: Allergy

## 2016-07-27 ENCOUNTER — Other Ambulatory Visit: Payer: Self-pay | Admitting: Women's Health

## 2016-07-27 VITALS — BP 120/75 | HR 75 | Temp 98.4°F | Resp 16 | Ht 63.39 in | Wt 174.0 lb

## 2016-07-27 DIAGNOSIS — Z3041 Encounter for surveillance of contraceptive pills: Secondary | ICD-10-CM

## 2016-07-27 DIAGNOSIS — L508 Other urticaria: Secondary | ICD-10-CM | POA: Diagnosis not present

## 2016-07-27 DIAGNOSIS — R0981 Nasal congestion: Secondary | ICD-10-CM | POA: Diagnosis not present

## 2016-07-27 DIAGNOSIS — T783XXA Angioneurotic edema, initial encounter: Secondary | ICD-10-CM | POA: Diagnosis not present

## 2016-07-27 MED ORDER — TRIAMCINOLONE ACETONIDE 55 MCG/ACT NA AERO: INHALATION_SPRAY | NASAL | 5 refills | Status: DC

## 2016-07-27 MED ORDER — LEVOCETIRIZINE DIHYDROCHLORIDE 5 MG PO TABS: 5.0000 mg | ORAL_TABLET | Freq: Every evening | ORAL | 5 refills | Status: DC

## 2016-07-27 MED ORDER — MONTELUKAST SODIUM 10 MG PO TABS: 10.0000 mg | ORAL_TABLET | Freq: Every day | ORAL | 5 refills | Status: DC

## 2016-07-27 MED ORDER — RANITIDINE HCL 150 MG PO TABS: 150.0000 mg | ORAL_TABLET | Freq: Two times a day (BID) | ORAL | 5 refills | Status: DC

## 2016-07-27 NOTE — Progress Notes (Signed)
New Patient Note  RE: Betty Price MRN: [id number] DOB: 1966/04/28 Date of Office Visit: 07/27/2016  Referring provider: No ref. provider found Primary care provider: Huel Cote, NP  Chief Complaint: hives  History of present illness: Betty Price is a 51 y.o. female presenting today for evaluation of hives.  She is a former patient of our practice with her last visit in September 2010 for urticaria.  Hives resolved in 2011 after her last visit.  She has a sister with Hashimoto's thyroiditis diagnosed around the time her hives ended.   She was checked for thyroid disease around that time and was negative.  She also removed gluten from her diet back then and feels it helped to resolve her hives.  She went 7 years without any issues with hives.  She states she has been able to eat some gluten in diet over the last several years without any issue.    Hives return about a month ago.  Hives are worse than they were years ago.   Hives are itchy but appear to be lasting longer about 48-72 hours.   She is also having swelling associated with hives mostly of her feet.  Hives do not leave any marks or bruising.   She denies any GI, respiratory or CV related symptoms.  She denies any new foods, no medications, no stings, no changes in soaps/lotions/detergents.  She has tried OTC antihistamine where she has been taking one tab in the morning and tab in the evening.  She also has been using benadryl topically which has been somewhat helpful.    She does report some nasal stuffiness that she is using Vick's for which helps.  She denies a history of seasonal allergies.    Review of systems: Review of Systems  Constitutional: Negative for chills, fever and malaise/fatigue.  HENT: Positive for congestion. Negative for ear discharge, nosebleeds, sinus pain, sore throat and tinnitus.   Eyes: Negative for discharge and redness.  Respiratory: Negative for cough, shortness of breath and wheezing.     Cardiovascular: Negative for palpitations.  Gastrointestinal: Negative for abdominal pain, nausea and vomiting.  Musculoskeletal: Negative for joint pain and myalgias.  Skin: Positive for itching and rash.  Neurological: Negative for headaches.    All other systems negative unless noted above in HPI  Past medical history: Past Medical History:  Diagnosis Date  . Gluten free diet   . Hives 2012   SAW DR. HICKS    Past surgical history: Past Surgical History:  Procedure Laterality Date  . GYNECOLOGIC CRYOSURGERY  05/08/2006   cin 1  . lasix surgery    . MENISCUS REPAIR  10/2009    Family history:  Family History  Problem Relation Age of Onset  . Heart disease Father     died of MI  . Hypertension Father   . Allergic rhinitis Father   . Diabetes Maternal Grandmother   . Allergic rhinitis Mother     Social history: She lives in a home with carpeting with gas and electric heating and central cooling. There are no pets in the home. There is no concern for water damage, mildew or roaches in the home. She is a Nurse, learning disability. She has no smoking history.   Medication List: Allergies as of 07/27/2016   No Known Allergies     Medication List       Accurate as of 07/27/16 12:29 PM. Always use your most recent med list.  MICROGESTIN 1-20 MG-MCG tablet Generic drug:  norethindrone-ethinyl estradiol TAKE ONE TABLET BY MOUTH ONCE DAILY. ANNUAL EXAM DUE       Known medication allergies: No Known Allergies   Physical examination: Blood pressure 120/75, pulse 75, temperature 98.4 F (36.9 C), temperature source Oral, resp. rate 16, height 5' 3.39" (1.61 m), weight 174 lb (78.9 kg), SpO2 98 %.  General: Alert, interactive, in no acute distress. HEENT: TMs pearly gray, turbinates minimally edematous without discharge, post-pharynx non erythematous. Neck: Supple without lymphadenopathy. Lungs: Clear to auscultation without wheezing, rhonchi or rales. {no  increased work of breathing. CV: Normal S1, S2 without murmurs. Abdomen: Nondistended, nontender. Skin: Large erythematous urticarial type lesions located on her abdomen , nonvesicular. Extremities:  No clubbing, cyanosis or edema. Neuro:   Grossly intact.  Diagnositics/Labs: None today  Assessment and plan: Patient Instructions  Hives and swelling        -  Recurrence of hives and swelling is most likely same process from your hives several years ago.   It is not uncommon for hives and swelling to return sporadically once you have a history of hives and swelling       - will obtain following labs: CBC w diff, CMP, ESR, CRP, chronic hive panel with TSH, and environmental panel      - start Xyzal 15m twice a day      - try Zantac 1542mtwice a day (let usKoreanow if you do not tolerate this medication)       - start singulair 107maily at bedtime  Nasal congestion     - will obtain environmental allergen panel     - use nasacort 1-2 sprays each nostril daily as needed for congestion    Follow-up 2-3 months        I appreciate the opportunity to take part in [place]. Please do not hesitate to contact me with questions.  Sincerely,   ShaPrudy FeelerD Allergy/[facility]ology [facility]

## 2016-07-27 NOTE — Patient Instructions (Addendum)
Hives and swelling        -  Recurrence of hives and swelling is most likely same process from your hives several years ago.   It is not uncommon for hives and swelling to return sporadically once you have a history of hives and swelling       - will obtain following labs: CBC w diff, CMP, ESR, CRP, chronic hive panel with TSH, and environmental panel      - start Xyzal '5mg'$  twice a day      - try Zantac '150mg'$  twice a day (let us know if you do not tolerate this medication)       - start singulair '10mg'$  daily at bedtime  Nasal congestion     - will obtain environmental allergen panel     - use nasacort 1-2 sprays each nostril daily as needed for congestion    Follow-up 2-3 months

## 2016-08-02 LAB — COMPREHENSIVE METABOLIC PANEL
A/G RATIO: 1.5 (ref 1.2–2.2)
ALT: 35 IU/L — AB (ref 0–32)
AST: 21 IU/L (ref 0–40)
Albumin: 4.2 g/dL (ref 3.5–5.5)
Alkaline Phosphatase: 61 IU/L (ref 39–117)
BILIRUBIN TOTAL: 0.3 mg/dL (ref 0.0–1.2)
BUN / CREAT RATIO: 20 (ref 9–23)
BUN: 19 mg/dL (ref 6–24)
CALCIUM: 9.7 mg/dL (ref 8.7–10.2)
CHLORIDE: 100 mmol/L (ref 96–106)
CO2: 22 mmol/L (ref 18–29)
Creatinine, Ser: 0.97 mg/dL (ref 0.57–1.00)
GFR, EST AFRICAN AMERICAN: 79 mL/min/{1.73_m2} (ref >59)
GFR, EST NON AFRICAN AMERICAN: 68 mL/min/{1.73_m2} (ref >59)
Globulin, Total: 2.8 g/dL (ref 1.5–4.5)
Glucose: 129 mg/dL — ABNORMAL HIGH (ref 65–99)
POTASSIUM: 4.8 mmol/L (ref 3.5–5.2)
Sodium: 140 mmol/L (ref 134–144)
TOTAL PROTEIN: 7 g/dL (ref 6.0–8.5)

## 2016-08-02 LAB — CBC WITH DIFFERENTIAL/PLATELET
BASOS: 0 %
Basophils Absolute: 0 10*3/uL (ref 0.0–0.2)
EOS (ABSOLUTE): 0.1 10*3/uL (ref 0.0–0.4)
Eos: 1 %
HEMOGLOBIN: 14.2 g/dL (ref 11.1–15.9)
Hematocrit: 40.4 % (ref 34.0–46.6)
IMMATURE GRANS (ABS): 0 10*3/uL (ref 0.0–0.1)
Immature Granulocytes: 0 %
LYMPHS ABS: 1.5 10*3/uL (ref 0.7–3.1)
LYMPHS: 29 %
MCH: 33.7 pg — AB (ref 26.6–33.0)
MCHC: 35.1 g/dL (ref 31.5–35.7)
MCV: 96 fL (ref 79–97)
Monocytes Absolute: 0.3 10*3/uL (ref 0.1–0.9)
Monocytes: 6 %
NEUTROS ABS: 3.4 10*3/uL (ref 1.4–7.0)
Neutrophils: 64 %
Platelets: 344 10*3/uL (ref 150–379)
RBC: 4.21 x10E6/uL (ref 3.77–5.28)
RDW: 13.6 % (ref 12.3–15.4)
WBC: 5.2 10*3/uL (ref 3.4–10.8)

## 2016-08-02 LAB — ALLERGENS, ZONE 3
Alternaria Alternata IgE: <0.1 kU/L
Aspergillus Fumigatus IgE: <0.1 kU/L
Bahia Grass IgE: <0.1 kU/L
Bermuda Grass IgE: <0.1 kU/L
Cedar, Mountain IgE: <0.1 kU/L
Common Silver Birch IgE: <0.1 kU/L
D Farinae IgE: <0.1 kU/L
Maple/Box Elder IgE: <0.1 kU/L
Mucor Racemosus IgE: <0.1 kU/L
Nettle IgE: <0.1 kU/L
Oak, White IgE: <0.1 kU/L
Pigweed, Rough IgE: <0.1 kU/L
Plantain, English IgE: <0.1 kU/L
Ragweed, Short IgE: <0.1 kU/L
Stemphylium Herbarum IgE: <0.1 kU/L

## 2016-08-02 LAB — TSH: TSH: 1.26 u[IU]/mL (ref 0.450–4.500)

## 2016-08-02 LAB — SEDIMENTATION RATE: Sed Rate: 3 mm/hr (ref 0–40)

## 2016-08-02 LAB — CHRONIC URTICARIA: cu index: 2.4 (ref <10)

## 2016-08-02 LAB — TRYPTASE: Tryptase: 4.7 ug/L (ref 2.2–13.2)

## 2016-08-02 LAB — C-REACTIVE PROTEIN: CRP: 35 mg/L — AB (ref 0.0–4.9)

## 2016-08-08 NOTE — Addendum Note (Signed)
Addended by: Mliss Fritz I on: 08/08/2016 09:16 AM   Modules accepted: Orders

## 2016-08-09 LAB — C-REACTIVE PROTEIN: CRP: 22 mg/L — ABNORMAL HIGH (ref 0.0–4.9)

## 2016-08-21 ENCOUNTER — Ambulatory Visit
Admission: RE | Admit: 2016-08-21 | Discharge: 2016-08-21 | Disposition: A | Discharge status: Home / Self Care | Payer: BLUE CROSS/BLUE SHIELD | Source: Ambulatory Visit | Attending: Women's Health | Admitting: Women's Health

## 2016-08-21 DIAGNOSIS — Z1231 Encounter for screening mammogram for malignant neoplasm of breast: Secondary | ICD-10-CM

## 2016-08-22 ENCOUNTER — Encounter: Payer: Self-pay | Admitting: Women's Health

## 2016-09-04 ENCOUNTER — Ambulatory Visit (INDEPENDENT_AMBULATORY_CARE_PROVIDER_SITE_OTHER): Payer: BLUE CROSS/BLUE SHIELD | Admitting: Women's Health

## 2016-09-04 ENCOUNTER — Encounter: Payer: Self-pay | Admitting: Women's Health

## 2016-09-04 ENCOUNTER — Other Ambulatory Visit: Payer: Self-pay | Admitting: Women's Health

## 2016-09-04 VITALS — BP 118/80 | Ht 63.0 in | Wt 173.0 lb

## 2016-09-04 DIAGNOSIS — R635 Abnormal weight gain: Secondary | ICD-10-CM

## 2016-09-04 DIAGNOSIS — Z13220 Encounter for screening for lipoid disorders: Secondary | ICD-10-CM | POA: Diagnosis not present

## 2016-09-04 DIAGNOSIS — F5101 Primary insomnia: Secondary | ICD-10-CM | POA: Diagnosis not present

## 2016-09-04 DIAGNOSIS — Z3041 Encounter for surveillance of contraceptive pills: Secondary | ICD-10-CM

## 2016-09-04 DIAGNOSIS — R5383 Other fatigue: Secondary | ICD-10-CM | POA: Diagnosis not present

## 2016-09-04 DIAGNOSIS — Z01419 Encounter for gynecological examination (general) (routine) without abnormal findings: Secondary | ICD-10-CM | POA: Diagnosis not present

## 2016-09-04 LAB — CBC WITH DIFFERENTIAL/PLATELET
Basophils Absolute: 56 cells/uL (ref 0–200)
Basophils Relative: 1 %
EOS PCT: 4 %
Eosinophils Absolute: 224 cells/uL (ref 15–500)
HEMATOCRIT: 39.6 % (ref 35.0–45.0)
HEMOGLOBIN: 13 g/dL (ref 11.7–15.5)
LYMPHS ABS: 1568 {cells}/uL (ref 850–3900)
Lymphocytes Relative: 28 %
MCH: 31.8 pg (ref 27.0–33.0)
MCHC: 32.8 g/dL (ref 32.0–36.0)
MCV: 96.8 fL (ref 80.0–100.0)
MONO ABS: 448 {cells}/uL (ref 200–950)
MPV: 9.6 fL (ref 7.5–12.5)
Monocytes Relative: 8 %
NEUTROS ABS: 3304 {cells}/uL (ref 1500–7800)
Neutrophils Relative %: 59 %
Platelets: 312 10*3/uL (ref 140–400)
RBC: 4.09 MIL/uL (ref 3.80–5.10)
RDW: 13.3 % (ref 11.0–15.0)
WBC: 5.6 10*3/uL (ref 3.8–10.8)

## 2016-09-04 LAB — COMPREHENSIVE METABOLIC PANEL
ALBUMIN: 4 g/dL (ref 3.6–5.1)
ALT: 25 U/L (ref 6–29)
AST: 18 U/L (ref 10–35)
Alkaline Phosphatase: 43 U/L (ref 33–130)
BILIRUBIN TOTAL: 0.3 mg/dL (ref 0.2–1.2)
BUN: 15 mg/dL (ref 7–25)
CALCIUM: 9.3 mg/dL (ref 8.6–10.4)
CO2: 20 mmol/L (ref 20–31)
CREATININE: 1.03 mg/dL (ref 0.50–1.05)
Chloride: 106 mmol/L (ref 98–110)
Glucose, Bld: 118 mg/dL — ABNORMAL HIGH (ref 65–99)
Potassium: 4.1 mmol/L (ref 3.5–5.3)
SODIUM: 139 mmol/L (ref 135–146)
TOTAL PROTEIN: 6.4 g/dL (ref 6.1–8.1)

## 2016-09-04 LAB — LIPID PANEL
Cholesterol: 179 mg/dL (ref <200)
HDL: 64 mg/dL (ref >50)
LDL CALC: 89 mg/dL (ref <100)
TRIGLYCERIDES: 131 mg/dL (ref <150)
Total CHOL/HDL Ratio: 2.8 Ratio (ref <5.0)
VLDL: 26 mg/dL (ref <30)

## 2016-09-04 LAB — TSH: TSH: 2.32 mIU/L

## 2016-09-04 MED ORDER — NORETHINDRONE ACET-ETHINYL EST 1-20 MG-MCG PO TABS: ORAL_TABLET | ORAL | 4 refills | Status: DC

## 2016-09-04 NOTE — Progress Notes (Addendum)
Erven CollaJill B Grandstaff [birth year] [id number]    History:    Presents for annual exam.  Amenorrheic on Loestrin. 2017 FSH 3. 2008 cryo-with normal Paps after. Normal mammogram history. History of hives has follow-up scheduled, positive C-reactive protein. Struggling with her weight even with Weight Watchers, playing tennis several times weekly and walking daily.  Past medical history, past surgical history, family history and social history were all reviewed and documented in the EPIC chart. Works from home. Good relationship with husband. [name] 17 doing well, has had Gardasil.  ROS:  A ROS was performed and pertinent positives and negatives are included.  Exam:  Vitals:   09/04/16 0847  BP: 118/80  Weight: 173 lb (78.5 kg)  Height: 5\' 3"  (1.6 m)   Body mass index is 30.65 kg/m.   General appearance:  Normal Thyroid:  Symmetrical, normal in size, without palpable masses or nodularity. Respiratory  Auscultation:  Clear without wheezing or rhonchi Cardiovascular  Auscultation:  Regular rate, without rubs, murmurs or gallops  Edema/varicosities:  Not grossly evident Abdominal  Soft,nontender, without masses, guarding or rebound.  Liver/spleen:  No organomegaly noted  Hernia:  None appreciated  Skin  Inspection:  Grossly normal   Breasts: Examined lying and sitting.     Right: Without masses, retractions, discharge or axillary adenopathy.     Left: Without masses, retractions, discharge or axillary adenopathy. Gentitourinary   Inguinal/mons:  Normal without inguinal adenopathy  External genitalia:  Normal  BUS/Urethra/Skene's glands:  Normal  Vagina:  Normal  Cervix:  Normal  Uterus:  normal in size, shape and contour.  Midline and mobile  Adnexa/parametria:     Rt: Without masses or tenderness.   Lt: Without masses or tenderness.  Anus and perineum: Normal  Digital rectal exam: Normal sphincter tone without palpated masses or tenderness  Assessment/Plan:  51 y.o. MWF G1 P1 for  annual exam with complaint of weight gain despite low carb/healthy diet and regular exercise.  Amenorrheic on Loestrin 2008 cryo-for CIN-1 normal Paps after Obesity Hives-allergist  Plan: Continue healthy lifestyle of diet and exercise, continue Weight Watchers. SBE's, continue annual screening mammogram. Loestrin 1/20 prescription, proper use given and reviewed slight risk for blood clots and strokes. Screening colonoscopy reviewed, [facility] information given, instructed to schedule. CBC, lipid panel, CMP, FSH, TSH, Pap with HR HPV typing, new screening guidelines reviewed.  Harrington Challengerancy J Young [facility] PavilionWHNP, 9:31 AM 09/04/2016

## 2016-09-04 NOTE — Patient Instructions (Signed)
Colonoscopy Dr Carlean Purl [place] [facility] for Postmenopausal Women Menopause is a normal process in which your reproductive ability comes to an end. This process happens gradually over a span of months to years, usually between the ages of 20 and 39. Menopause is complete when you have missed 12 consecutive menstrual periods. It is important to talk with your health care provider about some of the most common conditions that affect postmenopausal women, such as heart disease, cancer, and bone loss (osteoporosis). Adopting a healthy lifestyle and getting preventive care can help to promote your health and wellness. Those actions can also lower your chances of developing some of these common conditions. What should I know about menopause? During menopause, you may experience a number of symptoms, such as:  Moderate-to-severe hot flashes.  Night sweats.  Decrease in sex drive.  Mood swings.  Headaches.  Tiredness.  Irritability.  Memory problems.  Insomnia. Choosing to treat or not to treat menopausal changes is an individual decision that you make with your health care provider. What should I know about hormone replacement therapy and supplements? Hormone therapy products are effective for treating symptoms that are associated with menopause, such as hot flashes and night sweats. Hormone replacement carries certain risks, especially as you become older. If you are thinking about using estrogen or estrogen with progestin treatments, discuss the benefits and risks with your health care provider. What should I know about heart disease and stroke? Heart disease, heart attack, and stroke become more likely as you age. This may be due, in part, to the hormonal changes that your body experiences during menopause. These can affect how your body processes dietary fats, triglycerides, and cholesterol. Heart attack and stroke are both medical emergencies. There are many things that you  can do to help prevent heart disease and stroke:  Have your blood pressure checked at least every 1-2 years. High blood pressure causes heart disease and increases the risk of stroke.  If you are 53-85 years old, ask your health care provider if you should take aspirin to prevent a heart attack or a stroke.  Do not use any tobacco products, including cigarettes, chewing tobacco, or electronic cigarettes. If you need help quitting, ask your health care provider.  It is important to eat a healthy diet and maintain a healthy weight.  Be sure to include plenty of vegetables, fruits, low-fat dairy products, and lean protein.  Avoid eating foods that are high in solid fats, added sugars, or salt (sodium).  Get regular exercise. This is one of the most important things that you can do for your health.  Try to exercise for at least 150 minutes each week. The type of exercise that you do should increase your heart rate and make you sweat. This is known as moderate-intensity exercise.  Try to do strengthening exercises at least twice each week. Do these in addition to the moderate-intensity exercise.  Know your numbers.Ask your health care provider to check your cholesterol and your blood glucose. Continue to have your blood tested as directed by your health care provider. What should I know about cancer screening? There are several types of cancer. Take the following steps to reduce your risk and to catch any cancer development as early as possible. Breast Cancer  Practice breast self-awareness.  This means understanding how your breasts normally appear and feel.  It also means doing regular breast self-exams. Let your health care provider know about any changes, no matter how small.  If you are 4 or older, have a clinician do a breast exam (clinical breast exam or CBE) every year. Depending on your age, family history, and medical history, it may be recommended that you also have a yearly  breast X-ray (mammogram).  If you have a family history of breast cancer, talk with your health care provider about genetic screening.  If you are at high risk for breast cancer, talk with your health care provider about having an MRI and a mammogram every year.  Breast cancer (BRCA) gene test is recommended for women who have family members with BRCA-related cancers. Results of the assessment will determine the need for genetic counseling and BRCA1 and for BRCA2 testing. BRCA-related cancers include these types:  Breast. This occurs in males or females.  Ovarian.  Tubal. This may also be called fallopian tube cancer.  Cancer of the abdominal or pelvic lining (peritoneal cancer).  Prostate.  Pancreatic. Cervical, Uterine, and Ovarian Cancer  Your health care provider may recommend that you be screened regularly for cancer of the pelvic organs. These include your ovaries, uterus, and vagina. This screening involves a pelvic exam, which includes checking for microscopic changes to the surface of your cervix (Pap test).  For women ages 21-65, health care providers may recommend a pelvic exam and a Pap test every three years. For women ages 87-65, they may recommend the Pap test and pelvic exam, combined with testing for human papilloma virus (HPV), every five years. Some types of HPV increase your risk of cervical cancer. Testing for HPV may also be done on women of any age who have unclear Pap test results.  Other health care providers may not recommend any screening for nonpregnant women who are considered low risk for pelvic cancer and have no symptoms. Ask your health care provider if a screening pelvic exam is right for you.  If you have had past treatment for cervical cancer or a condition that could lead to cancer, you need Pap tests and screening for cancer for at least 20 years after your treatment. If Pap tests have been discontinued for you, your risk factors (such as having a new  sexual partner) need to be reassessed to determine if you should start having screenings again. Some women have medical problems that increase the chance of getting cervical cancer. In these cases, your health care provider may recommend that you have screening and Pap tests more often.  If you have a family history of uterine cancer or ovarian cancer, talk with your health care provider about genetic screening.  If you have vaginal bleeding after reaching menopause, tell your health care provider.  There are currently no reliable tests available to screen for ovarian cancer. Lung Cancer  Lung cancer screening is recommended for adults 49-72 years old who are at high risk for lung cancer because of a history of smoking. A yearly low-dose CT scan of the lungs is recommended if you:  Currently smoke.  Have a history of at least 30 pack-years of smoking and you currently smoke or have quit within the past 15 years. A pack-year is smoking an average of one pack of cigarettes per day for one year. Yearly screening should:  Continue until it has been 15 years since you quit.  Stop if you develop a health problem that would prevent you from having lung cancer treatment. Colorectal Cancer  This type of cancer can be detected and can often be prevented.  Routine colorectal cancer screening usually  begins at age 2 and continues through age 12.  If you have risk factors for colon cancer, your health care provider may recommend that you be screened at an earlier age.  If you have a family history of colorectal cancer, talk with your health care provider about genetic screening.  Your health care provider may also recommend using home test kits to check for hidden blood in your stool.  A small camera at the end of a tube can be used to examine your colon directly (sigmoidoscopy or colonoscopy). This is done to check for the earliest forms of colorectal cancer.  Direct examination of the colon  should be repeated every 5-10 years until age 43. However, if early forms of precancerous polyps or small growths are found or if you have a family history or genetic risk for colorectal cancer, you may need to be screened more often. Skin Cancer  Check your skin from head to toe regularly.  Monitor any moles. Be sure to tell your health care provider:  About any new moles or changes in moles, especially if there is a change in a mole's shape or color.  If you have a mole that is larger than the size of a pencil eraser.  If any of your family members has a history of skin cancer, especially at a Jacobb Alen age, talk with your health care provider about genetic screening.  Always use sunscreen. Apply sunscreen liberally and repeatedly throughout the day.  Whenever you are outside, protect yourself by wearing long sleeves, pants, a wide-brimmed hat, and sunglasses. What should I know about osteoporosis? Osteoporosis is a condition in which bone destruction happens more quickly than new bone creation. After menopause, you may be at an increased risk for osteoporosis. To help prevent osteoporosis or the bone fractures that can happen because of osteoporosis, the following is recommended:  If you are 28-57 years old, get at least 1,000 mg of calcium and at least 600 mg of vitamin D per day.  If you are older than age 65 but younger than age 70, get at least 1,200 mg of calcium and at least 600 mg of vitamin D per day.  If you are older than age 101, get at least 1,200 mg of calcium and at least 800 mg of vitamin D per day. Smoking and excessive alcohol intake increase the risk of osteoporosis. Eat foods that are rich in calcium and vitamin D, and do weight-bearing exercises several times each week as directed by your health care provider. What should I know about how menopause affects my mental health? Depression may occur at any age, but it is more common as you become older. Common symptoms of  depression include:  Low or sad mood.  Changes in sleep patterns.  Changes in appetite or eating patterns.  Feeling an overall lack of motivation or enjoyment of activities that you previously enjoyed.  Frequent crying spells. Talk with your health care provider if you think that you are experiencing depression. What should I know about immunizations? It is important that you get and maintain your immunizations. These include:  Tetanus, diphtheria, and pertussis (Tdap) booster vaccine.  Influenza every year before the flu season begins.  Pneumonia vaccine.  Shingles vaccine. Your health care provider may also recommend other immunizations. This information is not intended to replace advice given to you by your health care provider. Make sure you discuss any questions you have with your health care provider. Document Released: 06/09/2005 Document Revised: 11/05/2015  Document Reviewed: 01/19/2015 Elsevier Interactive Patient Education  2017 [employer].

## 2016-09-04 NOTE — Addendum Note (Signed)
Addended by: Kem ParkinsonBARNES, Kizzie Cotten on: 09/04/2016 09:41 AM   Modules accepted: Orders

## 2016-09-05 LAB — FOLLICLE STIMULATING HORMONE: FSH: 8.9 m[IU]/mL

## 2016-09-06 LAB — PAP, TP IMAGING W/ HPV RNA, RFLX HPV TYPE 16,18/45: HPV mRNA, High Risk: NOT DETECTED

## 2016-09-06 LAB — HEMOGLOBIN A1C
HEMOGLOBIN A1C: 5.1 % (ref <5.7)
MEAN PLASMA GLUCOSE: 100 mg/dL

## 2016-09-13 ENCOUNTER — Encounter: Payer: Self-pay | Admitting: Gynecology

## 2016-09-29 ENCOUNTER — Ambulatory Visit: Payer: BLUE CROSS/BLUE SHIELD | Admitting: Allergy

## 2016-10-04 ENCOUNTER — Encounter: Payer: Self-pay | Admitting: Allergy

## 2016-10-04 ENCOUNTER — Ambulatory Visit (INDEPENDENT_AMBULATORY_CARE_PROVIDER_SITE_OTHER): Payer: BLUE CROSS/BLUE SHIELD | Admitting: Allergy

## 2016-10-04 VITALS — BP 112/66 | HR 67 | Temp 97.7°F | Resp 16 | Ht 63.0 in | Wt 174.0 lb

## 2016-10-04 DIAGNOSIS — J310 Chronic rhinitis: Secondary | ICD-10-CM

## 2016-10-04 DIAGNOSIS — T783XXD Angioneurotic edema, subsequent encounter: Secondary | ICD-10-CM

## 2016-10-04 DIAGNOSIS — L508 Other urticaria: Secondary | ICD-10-CM | POA: Diagnosis not present

## 2016-10-04 NOTE — Patient Instructions (Addendum)
Hives and swelling        -  Recurrence of hives and swelling is most likely same process from your hives several years ago.   It is not uncommon for hives and swelling to return sporadically once you have a history of hives and swelling       - labs previously obtain were reassuring and rheumatology evaluation also reassuring      - will obtain dairy  and gluten IgE labs today to complete work-up      - continue Xyzal 5mg  twice a day      - continue Zantac 150mg  twice a day      - continue singulair 10mg  daily at bedtime      - let us know if hives/symptoms start to become more frequent again and will consider initiating Xolair management  Nasal congestion     - will obtain environmental allergen panel     - use nasacort 1-2 sprays each nostril daily as needed for congestion    Follow-up 2-3 months

## 2016-10-04 NOTE — Progress Notes (Signed)
Follow-up Note  RE: [name] MRN: [id number] DOB: 1965-09-09 Date of Office Visit: 10/04/2016   History of present illness: Betty Price is a 51 y.o. female presenting today for follow-up of chronic urticaria and angioedema as well as nasal congestion. She was last seen in the office on 07/27/2016 by myself. At that time I obtained a workup for chronic was relatively reassuring however she did have persistent elevation of her inflammatory markers. I referred her to see rheumatology because of this who did not feel that her urticaria had an autoimmune cause and was more likely related to an allergic cause.  She has been on a regimen of Xyzal twice a day, Zantac twice a day and Singulair daily. She has seen a reduction in frequency of her hives. She now only reports episodes of 1-2 hives 2-3 times a month. She reports this is tolerable. She does use Nasacort on occasion to help with her nasal congestion but reports she has not needed to use it that often. She is still concerned that dairy or gluten-based products may be a trigger of her hives somewhat like to rule this out as a potential cause.     Review of systems: Review of Systems  Constitutional: Negative for chills, fever and malaise/fatigue.  HENT: Positive for congestion. Negative for ear discharge, ear pain, nosebleeds, sinus pain, sore throat and tinnitus.   Eyes: Negative for discharge and redness.  Respiratory: Negative for cough, shortness of breath and wheezing.   Cardiovascular: Negative for chest pain.  Gastrointestinal: Negative for abdominal pain, diarrhea, heartburn, nausea and vomiting.  Musculoskeletal: Negative for joint pain and myalgias.  Skin: Positive for itching and rash.  Neurological: Negative for headaches.    All other systems negative unless noted above in HPI  Past medical/social/surgical/family history have been reviewed and are unchanged unless specifically indicated below.  No  changes  Medication List: Allergies as of 10/04/2016      Reactions   Pollen Extract       Medication List       Accurate as of 10/04/16  5:12 PM. Always use your most recent med list.          DEPO-MEDROL 80 MG/ML injection Generic drug:  methylPREDNISolone acetate Depo-Medrol 80 mg/mL suspension for injection  Take 80 mg every day by injection route for 1 day.   levocetirizine 5 MG tablet Commonly known as:  XYZAL Take 1 tablet (5 mg total) by mouth every evening.   montelukast 10 MG tablet Commonly known as:  SINGULAIR Take 1 tablet (10 mg total) by mouth at bedtime.   norethindrone-ethinyl estradiol 1-20 MG-MCG tablet Commonly known as:  MICROGESTIN TAKE ONE TABLET BY MOUTH ONCE DAILY. ANNUAL EXAM DUE   ranitidine 150 MG tablet Commonly known as:  ZANTAC Take 1 tablet (150 mg total) by mouth 2 (two) times daily.   triamcinolone 55 MCG/ACT Aero nasal inhaler Commonly known as:  NASACORT AQ 1-2 sprays each nostril as needed for congestion.       Known medication allergies: Allergies  Allergen Reactions  . Pollen Extract      Physical examination: Blood pressure 112/66, pulse 67, temperature 97.7 F (36.5 C), temperature source Oral, resp. rate 16, height 5' 3" (1.6 m), weight 174 lb (78.9 kg), SpO2 96 %.  General: Alert, interactive, in no acute distress. HEENT: TMs pearly gray, turbinates mildly edematous without discharge, post-pharynx non erythematous. Neck: Supple without lymphadenopathy. Lungs: Clear to auscultation without wheezing, rhonchi or  rales. {no increased work of breathing. CV: Normal S1, S2 without murmurs. Abdomen: Nondistended, nontender. Skin: Warm and dry, without lesions or rashes. Extremities:  No clubbing, cyanosis or edema. Neuro:   Grossly intact.  Diagnositics/Labs: Labs:  Component     Latest Ref Rng & Units 07/27/2016  cu index     <10 2.4   Component     Latest Ref Rng & Units 07/27/2016  Glucose     65 - 99 mg/dL 129  (H)  BUN     6 - 24 mg/dL 19  Creatinine     0.57 - 1.00 mg/dL 0.97  EGFR (Non-African Amer.)     >59 mL/min/1.73 68  EGFR (African American)     >59 mL/min/1.73 79  BUN/Creatinine Ratio     9 - 23 20  Sodium     134 - 144 mmol/L 140  Potassium     3.5 - 5.2 mmol/L 4.8  Chloride     96 - 106 mmol/L 100  CO2     18 - 29 mmol/L 22  Calcium     8.7 - 10.2 mg/dL 9.7  Total Protein     6.0 - 8.5 g/dL 7.0  Albumin     3.5 - 5.5 g/dL 4.2  Globulin, Total     1.5 - 4.5 g/dL 2.8  Albumin/Globulin Ratio     1.2 - 2.2 1.5  Total Bilirubin     0.0 - 1.2 mg/dL 0.3  Alkaline Phosphatase     39 - 117 IU/L 61  AST     0 - 40 IU/L 21  ALT     0 - 32 IU/L 35 (H)   Component     Latest Ref Rng & Units 07/27/2016  WBC     3.4 - 10.8 x10E3/uL 5.2  RBC     3.77 - 5.28 x10E6/uL 4.21  Hemoglobin     11.1 - 15.9 g/dL 14.2  HCT     34.0 - 46.6 % 40.4  MCV     79 - 97 fL 96  MCH     26.6 - 33.0 pg 33.7 (H)  MCHC     31.5 - 35.7 g/dL 35.1  RDW     12.3 - 15.4 % 13.6  Platelets     150 - 379 x10E3/uL 344  Neutrophils     Not Estab. % 64  Lymphs     Not Estab. % 29  Monocytes     Not Estab. % 6  Eos     Not Estab. % 1  Basos     Not Estab. % 0  NEUT#     1.4 - 7.0 x10E3/uL 3.4  Lymphocyte #     0.7 - 3.1 x10E3/uL 1.5  Monocytes Absolute     0.1 - 0.9 x10E3/uL 0.3  EOS (ABSOLUTE)     0.0 - 0.4 x10E3/uL 0.1  Basophils Absolute     0.0 - 0.2 x10E3/uL 0.0  Immature Granulocytes     Not Estab. % 0  Immature Grans (Abs)     0.0 - 0.1 x10E3/uL 0.0   Component     Latest Ref Rng & Units 07/27/2016 08/08/2016  Sed Rate     0 - 40 mm/hr 3   CRP     0.0 - 4.9 mg/L 35.0 (H) 22.0 (H)   Component     Latest Ref Rng & Units 07/27/2016  Tryptase     2.2 - 13.2 ug/L 4.7  Component     Latest Ref Rng & Units 07/27/2016  Class Description      Comment  D Pteronyssinus IgE     Class 0 kU/L <0.10  D Farinae IgE     Class 0 kU/L <0.10  Cat Dander IgE     Class 0 kU/L  <0.10  Dog Dander IgE     Class 0 kU/L <0.10  [place] Grass IgE     Class 0 kU/L <0.10  [place] Bluegrass IgE     Class 0 kU/L <0.10  [place] Grass IgE     Class 0 kU/L <0.10  [place] Grass IgE     Class 0 kU/L <0.10  Cockroach, American IgE     Class 0 kU/L <0.10  Penicillium Chrysogen IgE     Class 0 kU/L <0.10  Cladosporium Herbarum IgE     Class 0 kU/L <0.10  Aspergillus Fumigatus IgE     Class 0 kU/L <0.10  Mucor Racemosus IgE     Class 0 kU/L <0.10  Alternaria Alternata IgE     Class 0 kU/L <0.10  Stemphylium Herbarum IgE     Class 0 kU/L <0.10  Common Silver Wendee Copp IgE     Class 0 kU/L <0.10  Oak, White IgE     Class 0 kU/L <0.10  Elm, American IgE     Class 0 kU/L <0.10  Maple/Box Elder IgE     Class 0 kU/L <0.10  Hickory, White IgE     Class 0 kU/L <0.10  White Mulberry IgE     Class 0 kU/L <0.10  Cedar, Mountain IgE     Class 0 kU/L <0.10  Ragweed, Short IgE     Class 0 kU/L <0.10  Plantain, English IgE     Class 0 kU/L <0.10  Pigweed, Rough IgE     Class 0 kU/L <0.10  Nettle IgE     Class 0 kU/L <0.10   Assessment and plan:   Chronic urticaria with angioedema       - Symptoms are improved and less frequent with the below high-dose antihistamine regimen      - labs previously obtain were reassuring and rheumatology evaluation also reassuring      - will obtain dairy  and gluten IgE labs today to complete work-up      - continue Xyzal 36m twice a day      - continue Zantac 1550mtwice a day      - continue singulair 1011maily at bedtime      - let us [place] if hives/symptoms start to become more frequent again and will consider initiating Xolair management  Nasal congestion     - Nonallergic in nature     - use nasacort 1-2 sprays each nostril daily as needed for congestion    Follow-up 2-3 months       I appreciate the opportunity to take part in [place]. Please do not hesitate to contact me with questions.  Sincerely,   ShaPrudy FeelerD Allergy/[facility]

## 2016-10-05 LAB — ALLERGEN MILK

## 2016-10-05 LAB — ALLERGEN, RYE, F5

## 2016-10-05 LAB — ALLERGEN BARLEY F6

## 2016-10-05 LAB — ALLERGEN,OAT,F7

## 2016-10-05 LAB — ALLERGEN, WHEAT, F4

## 2016-10-20 ENCOUNTER — Other Ambulatory Visit: Payer: Self-pay | Admitting: Women's Health

## 2016-10-20 DIAGNOSIS — Z3041 Encounter for surveillance of contraceptive pills: Secondary | ICD-10-CM

## 2016-10-24 ENCOUNTER — Telehealth: Payer: Self-pay

## 2016-10-24 NOTE — Telephone Encounter (Signed)
[place] and [place] needs [place]. Per [place] note [facility] phone number provided.

## 2017-01-15 ENCOUNTER — Ambulatory Visit: Payer: BLUE CROSS/BLUE SHIELD | Admitting: Allergy

## 2017-01-25 ENCOUNTER — Ambulatory Visit: Payer: BLUE CROSS/BLUE SHIELD | Admitting: Allergy

## 2017-03-19 ENCOUNTER — Encounter: Payer: Self-pay | Admitting: Internal Medicine

## 2017-05-02 ENCOUNTER — Ambulatory Visit (AMBULATORY_SURGERY_CENTER): Payer: Self-pay

## 2017-05-02 ENCOUNTER — Other Ambulatory Visit: Payer: Self-pay

## 2017-05-02 ENCOUNTER — Telehealth: Payer: Self-pay

## 2017-05-02 VITALS — Ht 64.8 in | Wt 182.6 lb

## 2017-05-02 DIAGNOSIS — Z1211 Encounter for screening for malignant neoplasm of colon: Secondary | ICD-10-CM

## 2017-05-02 MED ORDER — NA SULFATE-K SULFATE-MG SULF 17.5-3.13-1.6 GM/177ML PO SOLN: 1.0000 | Freq: Once | ORAL | 0 refills | Status: AC

## 2017-05-02 NOTE — Telephone Encounter (Signed)
I will defer to Cathlyn ParsonsJohn Nulty, CRNA regarding this allergy and propofol decision. My understanding is that it should be okay

## 2017-05-02 NOTE — Telephone Encounter (Signed)
I saw Betty Price today in [facility]. 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 Patient states she gets hives as her reaction to all of the food allergies. Is it ok for her to have Propofol for sedation? Thanks.   Betty DaneNancy Matilynn Dacey, LPN ( [facility] )

## 2017-05-02 NOTE — Progress Notes (Signed)
Denies allergies to eggs or soy products. Denies complication of anesthesia or sedation. Denies use of weight loss medication. Denies use of O2.   [facility] instructions given for colonoscopy.   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 A note will be sent to Dr. Rhea BeltonPyrtle and Dorma RussellJohn Nultey.

## 2017-05-03 NOTE — Telephone Encounter (Signed)
Betty Price and Dr. Rhea BeltonPyrtle,  This pt is cleared for anesthetic care with propofol at [facility].  Thanks,  Cathlyn ParsonsJohn Candyce Gambino

## 2017-05-09 ENCOUNTER — Encounter: Payer: Self-pay | Admitting: Internal Medicine

## 2017-05-16 ENCOUNTER — Ambulatory Visit (AMBULATORY_SURGERY_CENTER): Payer: Managed Care, Other (non HMO) | Admitting: Internal Medicine

## 2017-05-16 ENCOUNTER — Encounter: Payer: Self-pay | Admitting: Internal Medicine

## 2017-05-16 ENCOUNTER — Encounter: Payer: BLUE CROSS/BLUE SHIELD | Admitting: Internal Medicine

## 2017-05-16 ENCOUNTER — Other Ambulatory Visit: Payer: Self-pay

## 2017-05-16 VITALS — BP 130/73 | HR 64 | Temp 97.3°F | Resp 10 | Ht 64.8 in | Wt 182.0 lb

## 2017-05-16 DIAGNOSIS — Z1211 Encounter for screening for malignant neoplasm of colon: Secondary | ICD-10-CM | POA: Diagnosis present

## 2017-05-16 DIAGNOSIS — Z1212 Encounter for screening for malignant neoplasm of rectum: Secondary | ICD-10-CM | POA: Diagnosis not present

## 2017-05-16 MED ORDER — SODIUM CHLORIDE 0.9 % IV SOLN: 500.0000 mL | Freq: Once | INTRAVENOUS | Status: DC

## 2017-05-16 NOTE — Progress Notes (Signed)
Pt's states no medical or surgical changes since previsit or office visit. 

## 2017-05-16 NOTE — Progress Notes (Signed)
A and O x3. Report to RN. Tolerated MAC anesthesia well.

## 2017-05-16 NOTE — Op Note (Signed)
[facility] Patient Name: Betty Price Procedure Date: 05/16/2017 7:48 AM MRN: [id number] Endoscopist: Beverley FiedlerJay M Ryker Pherigo , MD Age: [age] Referring MD:  Date of Birth: Mar 06, 1966 Gender: Female Account #: [id number] Procedure:                Colonoscopy Indications:              Screening for colorectal malignant neoplasm, This                            is the patient's first colonoscopy Medicines:                Monitored Anesthesia Care Procedure:                Pre-Anesthesia Assessment:                           - Prior to the procedure, a History and Physical                            was performed, and patient medications and                            allergies were reviewed. The patient's tolerance of                            previous anesthesia was also reviewed. The risks                            and benefits of the procedure and the sedation                            options and risks were discussed with the patient.                            All questions were answered, and informed consent                            was obtained. Prior Anticoagulants: The patient has                            taken no previous anticoagulant or antiplatelet                            agents. ASA Grade Assessment: I - A normal, healthy                            patient. After reviewing the risks and benefits,                            the patient was deemed in satisfactory condition to                            undergo the procedure.  After obtaining informed consent, the colonoscope                            was passed under direct vision. Throughout the                            procedure, the patient's blood pressure, pulse, and                            oxygen saturations were monitored continuously. The                            Colonoscope was introduced through the anus and                            advanced to the the cecum, identified by                          appendiceal orifice and ileocecal valve. The                            colonoscopy was performed without difficulty. The                            patient tolerated the procedure well. The quality                            of the bowel preparation was good. The ileocecal                            valve, appendiceal orifice, and rectum were                            photographed. Scope In: 8:36:52 AM Scope Out: 8:52:48 AM Scope Withdrawal Time: 0 hours 10 minutes 4 seconds  Total Procedure Duration: 0 hours 15 minutes 56 seconds  Findings:                 The digital rectal exam was normal.                           The entire examined colon appeared normal on direct                            and retroflexion views. Complications:            No immediate complications. Estimated Blood Loss:     Estimated blood loss: none. Impression:               - The entire examined colon is normal on direct and                            retroflexion views.                           - No specimens collected. Recommendation:           -  Patient has a contact number available for                            emergencies. The signs and symptoms of potential                            delayed complications were discussed with the                            patient. Return to normal activities tomorrow.                            Written discharge instructions were provided to the                            patient.                           - Resume previous diet.                           - Continue present medications.                           - Repeat colonoscopy in 10 years for screening                            purposes. Beverley Fiedler, MD 05/16/2017 8:55:03 AM This report has been signed electronically.

## 2017-05-16 NOTE — Patient Instructions (Signed)
YOU HAD AN ENDOSCOPIC PROCEDURE TODAY AT THE [facility]:   Refer to the procedure report that was given to you for any specific questions about what was found during the examination.  If the procedure report does not answer your questions, please call your gastroenterologist to clarify.  If you requested that your care partner not be given the details of your procedure findings, then the procedure report has been included in a sealed envelope for you to review at your convenience later.  YOU SHOULD EXPECT: Some feelings of bloating in the abdomen. Passage of more gas than usual.  Walking can help get rid of the air that was put into your GI tract during the procedure and reduce the bloating. If you had a lower endoscopy (such as a colonoscopy or flexible sigmoidoscopy) you may notice spotting of blood in your stool or on the toilet paper. If you underwent a bowel prep for your procedure, you may not have a normal bowel movement for a few days.  Please Note:  You might notice some irritation and congestion in your nose or some drainage.  This is from the oxygen used during your procedure.  There is no need for concern and it should clear up in a day or so.  SYMPTOMS TO REPORT IMMEDIATELY:   Following lower endoscopy (colonoscopy or flexible sigmoidoscopy):  Excessive amounts of blood in the stool  Significant tenderness or worsening of abdominal pains  Swelling of the abdomen that is new, acute  Fever of 100F or higher   Following upper endoscopy (EGD)  Vomiting of blood or coffee ground material  New chest pain or pain under the shoulder blades  Painful or persistently difficult swallowing  New shortness of breath  Fever of 100F or higher  Black, tarry-looking stools  For urgent or emergent issues, a gastroenterologist can be reached at any hour by calling [phone].   DIET:  We do recommend a small meal at first, but then you may proceed to your regular diet.  Drink  plenty of fluids but you should avoid alcoholic beverages for 24 hours.  ACTIVITY:  You should plan to take it easy for the rest of today and you should NOT DRIVE or use heavy machinery until tomorrow (because of the sedation medicines used during the test).    FOLLOW UP: Our staff will call the number listed on your records the next business day following your procedure to check on you and address any questions or concerns that you may have regarding the information given to you following your procedure. If we do not reach you, we will leave a message.  However, if you are feeling well and you are not experiencing any problems, there is no need to return our call.  We will assume that you have returned to your regular daily activities without incident.  If any biopsies were taken you will be contacted by phone or by letter within the next 1-3 weeks.  Please call us at [phone] if you have not heard about the biopsies in 3 weeks.    SIGNATURES/CONFIDENTIALITY: You and/or your care partner have signed paperwork which will be entered into your electronic medical record.  These signatures attest to the fact that that the information above on your After Visit Summary has been reviewed and is understood.  Full responsibility of the confidentiality of this discharge information lies with you and/or your care-partner.  Recall colonoscopy 10 years. 

## 2017-05-17 ENCOUNTER — Telehealth: Payer: Self-pay | Admitting: *Deleted

## 2017-05-17 ENCOUNTER — Telehealth: Payer: Self-pay

## 2017-05-17 NOTE — Telephone Encounter (Signed)
  Follow up Call-  Call back number 05/16/2017  Post procedure Call Back phone  # [phone]  Permission to leave phone message Yes  Some recent data might be hidden     Left message

## 2017-05-17 NOTE — Telephone Encounter (Signed)
  Follow up Call-  Call back number 05/16/2017  Post procedure Call Back phone  # [phone]  Permission to leave phone message Yes  Some recent data might be hidden     Patient questions:  Message left to call us if necessary.

## 2017-07-09 ENCOUNTER — Other Ambulatory Visit: Payer: Self-pay | Admitting: Women's Health

## 2017-07-09 DIAGNOSIS — Z1231 Encounter for screening mammogram for malignant neoplasm of breast: Secondary | ICD-10-CM

## 2017-08-16 ENCOUNTER — Other Ambulatory Visit: Payer: Self-pay | Admitting: Women's Health

## 2017-08-16 DIAGNOSIS — Z3041 Encounter for surveillance of contraceptive pills: Secondary | ICD-10-CM

## 2017-08-16 NOTE — Telephone Encounter (Signed)
annual scheduled on 09/05/17  

## 2017-08-27 ENCOUNTER — Ambulatory Visit: Payer: Managed Care, Other (non HMO)

## 2017-09-05 ENCOUNTER — Encounter: Payer: BLUE CROSS/BLUE SHIELD | Admitting: Women's Health

## 2017-09-20 ENCOUNTER — Ambulatory Visit
Admission: RE | Admit: 2017-09-20 | Discharge: 2017-09-20 | Disposition: A | Discharge status: Home / Self Care | Payer: Managed Care, Other (non HMO) | Source: Ambulatory Visit | Attending: Women's Health | Admitting: Women's Health

## 2017-09-20 DIAGNOSIS — Z1231 Encounter for screening mammogram for malignant neoplasm of breast: Secondary | ICD-10-CM

## 2017-09-21 ENCOUNTER — Encounter (INDEPENDENT_AMBULATORY_CARE_PROVIDER_SITE_OTHER): Payer: Self-pay

## 2017-11-06 ENCOUNTER — Encounter: Payer: Self-pay | Admitting: Women's Health

## 2017-11-06 ENCOUNTER — Ambulatory Visit (INDEPENDENT_AMBULATORY_CARE_PROVIDER_SITE_OTHER): Payer: Managed Care, Other (non HMO) | Admitting: Women's Health

## 2017-11-06 VITALS — BP 116/79 | Ht 63.0 in | Wt 178.0 lb

## 2017-11-06 DIAGNOSIS — Z13220 Encounter for screening for lipoid disorders: Secondary | ICD-10-CM | POA: Diagnosis not present

## 2017-11-06 DIAGNOSIS — Z833 Family history of diabetes mellitus: Secondary | ICD-10-CM

## 2017-11-06 DIAGNOSIS — Z01419 Encounter for gynecological examination (general) (routine) without abnormal findings: Secondary | ICD-10-CM | POA: Diagnosis not present

## 2017-11-06 NOTE — Progress Notes (Signed)
Betty CollaJill B Price Sep 01, 1965 [id number]    History:    Presents for annual exam.  Amenorrheic on Loestrin until 9 months ago,  pharmacy was out of prescription so stopped and has been amenorrheic since with hot flushes daily that are tolerable.   2008 cryo LGSIL with normal Paps after.  Normal mammogram history.  05/2017- colonoscopy.  Problem with hives for the last 5 years and is now doing better after food allergies discovered.  Past medical history, past surgical history, family history and social history were all reviewed and documented in the EPIC chart.  Works from home.  [name] 18 doing well going to [employer] next year.  Continues to play tennis on a regular basis and does work out at [employer].  Father hypertension and heart disease  deceased.  ROS:  A ROS was performed and pertinent positives and negatives are included.  Exam:  Vitals:   11/06/17 0809  BP: 116/79  Weight: 178 lb (80.7 kg)  Height: 5\' 3"  (1.6 m)   Body mass index is 31.53 kg/m.   General appearance:  Normal Thyroid:  Symmetrical, normal in size, without palpable masses or nodularity. Respiratory  Auscultation:  Clear without wheezing or rhonchi Cardiovascular  Auscultation:  Regular rate, without rubs, murmurs or gallops  Edema/varicosities:  Not grossly evident Abdominal  Soft,nontender, without masses, guarding or rebound.  Liver/spleen:  No organomegaly noted  Hernia:  None appreciated  Skin  Inspection:  Grossly normal   Breasts: Examined lying and sitting.     Right: Without masses, retractions, discharge or axillary adenopathy.     Left: Without masses, retractions, discharge or axillary adenopathy. Gentitourinary   Inguinal/mons:  Normal without inguinal adenopathy  External genitalia:  Normal  BUS/Urethra/Skene's glands:  Normal  Vagina:  Normal  Cervix:  Normal  Uterus:  normal in size, shape and contour.  Midline and mobile  Adnexa/parametria:     Rt: Without masses or tenderness.   Lt: Without  masses or tenderness.  Anus and perineum: Normal  Digital rectal exam: Normal sphincter tone without palpated masses or tenderness  Assessment/Plan:  52 y.o. MWF G1 P1 for annual exam with no complaints.  Postmenopausal on no HRT with no bleeding Numerous food allergies/hives 2008 cryo with normal Paps after.  Plan: Menopause discussed, denies need for HRT, instructed to call if any further bleeding.  SBE's, continue annual screening mammogram, calcium rich foods, vitamin D 2000 daily encouraged.  Has had numerous labs at primary care, will check lipid panel, hemoglobin A1c, has had some elevated blood sugars in the past most likely related to prednisone.  Pap normal 2018, new screening guidelines reviewed.    Harrington Challengerancy J Young Dch Regional Medical CenterWHNP, 8:16 AM 11/06/2017

## 2017-11-06 NOTE — Patient Instructions (Signed)
Health Maintenance for Postmenopausal Women Menopause is a normal process in which your reproductive ability comes to an end. This process happens gradually over a span of months to years, usually between the ages of 22 and 9. Menopause is complete when you have missed 12 consecutive menstrual periods. It is important to talk with your health care provider about some of the most common conditions that affect postmenopausal women, such as heart disease, cancer, and bone loss (osteoporosis). Adopting a healthy lifestyle and getting preventive care can help to promote your health and wellness. Those actions can also lower your chances of developing some of these common conditions. What should I know about menopause? During menopause, you may experience a number of symptoms, such as:  Moderate-to-severe hot flashes.  Night sweats.  Decrease in sex drive.  Mood swings.  Headaches.  Tiredness.  Irritability.  Memory problems.  Insomnia.  Choosing to treat or not to treat menopausal changes is an individual decision that you make with your health care provider. What should I know about hormone replacement therapy and supplements? Hormone therapy products are effective for treating symptoms that are associated with menopause, such as hot flashes and night sweats. Hormone replacement carries certain risks, especially as you become older. If you are thinking about using estrogen or estrogen with progestin treatments, discuss the benefits and risks with your health care provider. What should I know about heart disease and stroke? Heart disease, heart attack, and stroke become more likely as you age. This may be due, in part, to the hormonal changes that your body experiences during menopause. These can affect how your body processes dietary fats, triglycerides, and cholesterol. Heart attack and stroke are both medical emergencies. There are many things that you can do to help prevent heart disease  and stroke:  Have your blood pressure checked at least every 1-2 years. High blood pressure causes heart disease and increases the risk of stroke.  If you are 53-22 years old, ask your health care provider if you should take aspirin to prevent a heart attack or a stroke.  Do not use any tobacco products, including cigarettes, chewing tobacco, or electronic cigarettes. If you need help quitting, ask your health care provider.  It is important to eat a healthy diet and maintain a healthy weight. ? Be sure to include plenty of vegetables, fruits, low-fat dairy products, and lean protein. ? Avoid eating foods that are high in solid fats, added sugars, or salt (sodium).  Get regular exercise. This is one of the most important things that you can do for your health. ? Try to exercise for at least 150 minutes each week. The type of exercise that you do should increase your heart rate and make you sweat. This is known as moderate-intensity exercise. ? Try to do strengthening exercises at least twice each week. Do these in addition to the moderate-intensity exercise.  Know your numbers.Ask your health care provider to check your cholesterol and your blood glucose. Continue to have your blood tested as directed by your health care provider.  What should I know about cancer screening? There are several types of cancer. Take the following steps to reduce your risk and to catch any cancer development as early as possible. Breast Cancer  Practice breast self-awareness. ? This means understanding how your breasts normally appear and feel. ? It also means doing regular breast self-exams. Let your health care provider know about any changes, no matter how small.  If you are 40  or older, have a clinician do a breast exam (clinical breast exam or CBE) every year. Depending on your age, family history, and medical history, it may be recommended that you also have a yearly breast X-ray (mammogram).  If you  have a family history of breast cancer, talk with your health care provider about genetic screening.  If you are at high risk for breast cancer, talk with your health care provider about having an MRI and a mammogram every year.  Breast cancer (BRCA) gene test is recommended for women who have family members with BRCA-related cancers. Results of the assessment will determine the need for genetic counseling and BRCA1 and for BRCA2 testing. BRCA-related cancers include these types: ? Breast. This occurs in males or females. ? Ovarian. ? Tubal. This may also be called fallopian tube cancer. ? Cancer of the abdominal or pelvic lining (peritoneal cancer). ? Prostate. ? Pancreatic.  Cervical, Uterine, and Ovarian Cancer Your health care provider may recommend that you be screened regularly for cancer of the pelvic organs. These include your ovaries, uterus, and vagina. This screening involves a pelvic exam, which includes checking for microscopic changes to the surface of your cervix (Pap test).  For women ages 21-65, health care providers may recommend a pelvic exam and a Pap test every three years. For women ages 79-65, they may recommend the Pap test and pelvic exam, combined with testing for human papilloma virus (HPV), every five years. Some types of HPV increase your risk of cervical cancer. Testing for HPV may also be done on women of any age who have unclear Pap test results.  Other health care providers may not recommend any screening for nonpregnant women who are considered low risk for pelvic cancer and have no symptoms. Ask your health care provider if a screening pelvic exam is right for you.  If you have had past treatment for cervical cancer or a condition that could lead to cancer, you need Pap tests and screening for cancer for at least 20 years after your treatment. If Pap tests have been discontinued for you, your risk factors (such as having a new sexual partner) need to be  reassessed to determine if you should start having screenings again. Some women have medical problems that increase the chance of getting cervical cancer. In these cases, your health care provider may recommend that you have screening and Pap tests more often.  If you have a family history of uterine cancer or ovarian cancer, talk with your health care provider about genetic screening.  If you have vaginal bleeding after reaching menopause, tell your health care provider.  There are currently no reliable tests available to screen for ovarian cancer.  Lung Cancer Lung cancer screening is recommended for adults 69-62 years old who are at high risk for lung cancer because of a history of smoking. A yearly low-dose CT scan of the lungs is recommended if you:  Currently smoke.  Have a history of at least 30 pack-years of smoking and you currently smoke or have quit within the past 15 years. A pack-year is smoking an average of one pack of cigarettes per day for one year.  Yearly screening should:  Continue until it has been 15 years since you quit.  Stop if you develop a health problem that would prevent you from having lung cancer treatment.  Colorectal Cancer  This type of cancer can be detected and can often be prevented.  Routine colorectal cancer screening usually begins at  age 42 and continues through age 45.  If you have risk factors for colon cancer, your health care provider may recommend that you be screened at an earlier age.  If you have a family history of colorectal cancer, talk with your health care provider about genetic screening.  Your health care provider may also recommend using home test kits to check for hidden blood in your stool.  A small camera at the end of a tube can be used to examine your colon directly (sigmoidoscopy or colonoscopy). This is done to check for the earliest forms of colorectal cancer.  Direct examination of the colon should be repeated every  5-10 years until age 71. However, if early forms of precancerous polyps or small growths are found or if you have a family history or genetic risk for colorectal cancer, you may need to be screened more often.  Skin Cancer  Check your skin from head to toe regularly.  Monitor any moles. Be sure to tell your health care provider: ? About any new moles or changes in moles, especially if there is a change in a mole's shape or color. ? If you have a mole that is larger than the size of a pencil eraser.  If any of your family members has a history of skin cancer, especially at a young age, talk with your health care provider about genetic screening.  Always use sunscreen. Apply sunscreen liberally and repeatedly throughout the day.  Whenever you are outside, protect yourself by wearing long sleeves, pants, a wide-brimmed hat, and sunglasses.  What should I know about osteoporosis? Osteoporosis is a condition in which bone destruction happens more quickly than new bone creation. After menopause, you may be at an increased risk for osteoporosis. To help prevent osteoporosis or the bone fractures that can happen because of osteoporosis, the following is recommended:  If you are 46-71 years old, get at least 1,000 mg of calcium and at least 600 mg of vitamin D per day.  If you are older than age 55 but younger than age 65, get at least 1,200 mg of calcium and at least 600 mg of vitamin D per day.  If you are older than age 54, get at least 1,200 mg of calcium and at least 800 mg of vitamin D per day.  Smoking and excessive alcohol intake increase the risk of osteoporosis. Eat foods that are rich in calcium and vitamin D, and do weight-bearing exercises several times each week as directed by your health care provider. What should I know about how menopause affects my mental health? Depression may occur at any age, but it is more common as you become older. Common symptoms of depression  include:  Low or sad mood.  Changes in sleep patterns.  Changes in appetite or eating patterns.  Feeling an overall lack of motivation or enjoyment of activities that you previously enjoyed.  Frequent crying spells.  Talk with your health care provider if you think that you are experiencing depression. What should I know about immunizations? It is important that you get and maintain your immunizations. These include:  Tetanus, diphtheria, and pertussis (Tdap) booster vaccine.  Influenza every year before the flu season begins.  Pneumonia vaccine.  Shingles vaccine.  Your health care provider may also recommend other immunizations. This information is not intended to replace advice given to you by your health care provider. Make sure you discuss any questions you have with your health care provider. Document Released: 06/09/2005  Document Revised: 11/05/2015 Document Reviewed: 01/19/2015 [employer] Interactive Patient Education  2018 [employer]  

## 2017-11-07 ENCOUNTER — Other Ambulatory Visit: Payer: Self-pay | Admitting: Women's Health

## 2017-11-07 DIAGNOSIS — E7800 Pure hypercholesterolemia, unspecified: Secondary | ICD-10-CM

## 2017-11-07 LAB — LIPID PANEL
CHOLESTEROL: 252 mg/dL — AB (ref <200)
HDL: 77 mg/dL (ref >50)
LDL CHOLESTEROL (CALC): 150 mg/dL — AB
Non-HDL Cholesterol (Calc): 175 mg/dL (calc) — ABNORMAL HIGH (ref <130)
Total CHOL/HDL Ratio: 3.3 (calc) (ref <5.0)
Triglycerides: 123 mg/dL (ref <150)

## 2017-11-07 LAB — HEMOGLOBIN A1C
EAG (MMOL/L): 6 (calc)
Hgb A1c MFr Bld: 5.4 % of total Hgb (ref <5.7)
Mean Plasma Glucose: 108 (calc)

## 2018-11-12 ENCOUNTER — Encounter: Payer: Managed Care, Other (non HMO) | Admitting: Women's Health

## 2019-01-29 ENCOUNTER — Encounter: Payer: Managed Care, Other (non HMO) | Admitting: Women's Health

## 2019-01-29 ENCOUNTER — Other Ambulatory Visit: Payer: Self-pay

## 2019-01-29 DIAGNOSIS — Z20822 Contact with and (suspected) exposure to covid-19: Secondary | ICD-10-CM

## 2019-01-30 LAB — NOVEL CORONAVIRUS, NAA: SARS-CoV-2, NAA: NOT DETECTED

## 2019-04-08 ENCOUNTER — Other Ambulatory Visit: Payer: Self-pay

## 2019-04-08 DIAGNOSIS — Z20822 Contact with and (suspected) exposure to covid-19: Secondary | ICD-10-CM

## 2019-04-09 LAB — NOVEL CORONAVIRUS, NAA: SARS-CoV-2, NAA: NOT DETECTED

## 2019-05-06 ENCOUNTER — Encounter: Payer: Managed Care, Other (non HMO) | Admitting: Women's Health

## 2019-06-30 ENCOUNTER — Other Ambulatory Visit: Payer: Self-pay | Admitting: Family Medicine

## 2019-06-30 DIAGNOSIS — Z1231 Encounter for screening mammogram for malignant neoplasm of breast: Secondary | ICD-10-CM

## 2019-07-04 ENCOUNTER — Other Ambulatory Visit: Payer: Self-pay

## 2019-07-04 ENCOUNTER — Ambulatory Visit
Admission: RE | Admit: 2019-07-04 | Discharge: 2019-07-04 | Disposition: A | Discharge status: Home / Self Care | Payer: Managed Care, Other (non HMO) | Source: Ambulatory Visit | Attending: Family Medicine | Admitting: Family Medicine

## 2019-07-04 DIAGNOSIS — Z1231 Encounter for screening mammogram for malignant neoplasm of breast: Secondary | ICD-10-CM

## 2019-08-21 ENCOUNTER — Encounter: Payer: Self-pay | Admitting: Family Medicine

## 2019-08-21 ENCOUNTER — Ambulatory Visit (INDEPENDENT_AMBULATORY_CARE_PROVIDER_SITE_OTHER): Payer: Managed Care, Other (non HMO)

## 2019-08-21 ENCOUNTER — Ambulatory Visit (INDEPENDENT_AMBULATORY_CARE_PROVIDER_SITE_OTHER): Payer: Managed Care, Other (non HMO) | Admitting: Family Medicine

## 2019-08-21 ENCOUNTER — Telehealth: Payer: Self-pay | Admitting: Family Medicine

## 2019-08-21 ENCOUNTER — Other Ambulatory Visit: Payer: Self-pay

## 2019-08-21 VITALS — BP 130/80 | HR 68 | Ht 63.0 in | Wt 181.0 lb

## 2019-08-21 DIAGNOSIS — M23204 Derangement of unspecified medial meniscus due to old tear or injury, left knee: Secondary | ICD-10-CM | POA: Diagnosis not present

## 2019-08-21 DIAGNOSIS — M25562 Pain in left knee: Secondary | ICD-10-CM

## 2019-08-21 MED ORDER — VITAMIN D (ERGOCALCIFEROL) 1.25 MG (50000 UNIT) PO CAPS: 50000.0000 [IU] | ORAL_CAPSULE | ORAL | 0 refills | Status: DC

## 2019-08-21 NOTE — Progress Notes (Signed)
[facility] [address] [place] [address] Phone: [phone] Subjective:    I'm seeing this patient by the request  of:  Harrington Challenger, NP   I, Debbe Odea, am serving as a scribe for Dr. Antoine Primas.  CC: Left knee pain  FVC:[id number]  Betty Price is a 54 y.o. female coming in with complaint of L knee pain   Onset- 4 weeks ago  Location- Medial knee Duration-  Character- feels like early stages of meniscus tear, sharp pain  Aggravating factors- walking, playing tennis, and pressure on L leg  Reliving factors- resting  Therapies tried-  Ice  Severity- at worst 6-7/10     Past Medical History:  Diagnosis Date  . Allergy   . Gluten free diet   . Hives 2012   SAW DR. HICKS   Past Surgical History:  Procedure Laterality Date  . GYNECOLOGIC CRYOSURGERY  05/08/2006   cin 1  . lasix surgery    . MENISCUS REPAIR  10/2009   Social History   Socioeconomic History  . Marital status: Married    Spouse name: Not on file  . Number of children: Not on file  . Years of education: Not on file  . Highest education level: Not on file  Occupational History  . Not on file  Tobacco Use  . Smoking status: Never Smoker  . Smokeless tobacco: Never Used  Substance and Sexual Activity  . Alcohol use: Yes    Alcohol/week: 0.0 standard drinks    Comment: occassional  . Drug use: No  . Sexual activity: Yes    Comment: INTERCOURSE AGE 62, SEXUAL PARTNERS MORE THAN 5  Other Topics Concern  . Not on file  Social History Narrative  . Not on file   Social Determinants of Health   Financial Resource Strain:   . Difficulty of Paying Living Expenses:   Food Insecurity:   . Worried About Programme researcher, broadcasting/film/video in the Last Year:   . Barista in the Last Year:   Transportation Needs:   . Freight forwarder (Medical):   Marland Kitchen Lack of Transportation (Non-Medical):   Physical Activity:   . Days of Exercise per Week:   . Minutes of  Exercise per Session:   Stress:   . Feeling of Stress :   Social Connections:   . Frequency of Communication with Friends and Family:   . Frequency of Social Gatherings with Friends and Family:   . Attends Religious Services:   . Active Member of Clubs or Organizations:   . Attends Banker Meetings:   Marland Kitchen Marital Status:    Allergies  Allergen Reactions  . Almond (Diagnostic) Hives  . Barley Grass Hives  . Corn-Containing Products Hives  . Gluten Meal Hives  . Grapefruit Concentrate Hives  . Lemon Balm [Melissa Officinalis] Hives  . Macadamia Nut Oil Hives  . Oat Hives  . Orange (Diagnostic) Hives  . Peanut-Containing Drug Products Hives  . Pistachio Nut (Diagnostic) Hives  . Pollen Extract Hives  . Raspberry Hives  . Rice Hives  . Rye Grass Flower Pollen Extract [Gramineae Pollens] Hives  . Scallops [Shellfish Allergy] Hives  . Sesame Seed (Diagnostic) Hives  . Soy Allergy Hives  . Strawberry (Diagnostic) Hives  . Tangerine Flavor Hives   Family History  Problem Relation Age of Onset  . Heart disease Father        died of MI  .  Hypertension Father   . Allergic rhinitis Father   . Diabetes Maternal Grandmother   . Allergic rhinitis Mother   . Colon cancer Neg Hx   . Esophageal cancer Neg Hx   . Pancreatic cancer Neg Hx   . Rectal cancer Neg Hx   . Stomach cancer Neg Hx       Current Outpatient Medications (Cardiovascular):  Marland Kitchen  EPINEPHrine 0.3 mg/0.3 mL IJ SOAJ injection, Inject into the muscle once.   Current Outpatient Medications (Respiratory):  .  levocetirizine (XYZAL) 5 MG tablet, Take 1 tablet (5 mg total) by mouth every evening. .  montelukast (SINGULAIR) 10 MG tablet, Take 1 tablet (10 mg total) by mouth at bedtime. .  triamcinolone (NASACORT AQ) 55 MCG/ACT AERO nasal inhaler, 1-2 sprays each nostril as needed for congestion.       Current Outpatient Medications (Other):  Marland Kitchen  OVER THE COUNTER MEDICATION, Fish Oil 750 mg. One tablet  daily. Marland Kitchen  OVER THE COUNTER MEDICATION, Tumeric 100 mg one tablet daily. Marland Kitchen  OVER THE COUNTER MEDICATION, Magnesium Citrate 100 mg. One tablet daily. .  ranitidine (ZANTAC) 150 MG tablet, Take 1 tablet (150 mg total) by mouth 2 (two) times daily. (Patient not taking: Reported on 11/06/2017)  Current Facility-Administered Medications (Other):  .  0.9 %  sodium chloride infusion   Reviewed prior external information including notes and imaging from  primary care provider As well as notes that were available from care everywhere and other healthcare systems.  Past medical history, social, surgical and family history all reviewed in electronic medical record.  No pertanent information unless stated regarding to the chief complaint.   Review of Systems:  No headache, visual changes, nausea, vomiting, diarrhea, constipation, dizziness, abdominal pain, skin rash, fevers, chills, night sweats, weight loss, swollen lymph nodes, body aches, joint swelling, chest pain, shortness of breath, mood changes. POSITIVE muscle aches  Objective  There were no vitals taken for this visit.   General: No apparent distress alert and oriented x3 mood and affect normal, dressed appropriately.  HEENT: Pupils equal, extraocular movements intact  Respiratory: Patient's speak in full sentences and does not appear short of breath  Cardiovascular: No lower extremity edema, non tender, no erythema  Neuro: Cranial nerves II through XII are intact, neurovascularly intact in all extremities with 2+ DTRs and 2+ pulses.  Gait normal with good balance and coordination.  MSK:  Non tender with full range of motion and good stability and symmetric strength and tone of shoulders, elbows, wrist, hip, knee and ankles bilaterally.  Knee: Left Normal to inspection with no erythema or effusion or obvious bony abnormalities. Palpation normal with no warmth, joint line tenderness, patellar tenderness, or condyle tenderness. ROM full in  flexion and extension and lower leg rotation. Ligaments with solid consistent endpoints including ACL, PCL, LCL, MCL. Positive Mcmurray's, Apley's, and Thessalonian tests. Non painful patellar compression. Patellar glide without crepitus. Patellar and quadriceps tendons unremarkable. Hamstring and quadriceps strength is normal.  MSK US performed of: Left knee This study was ordered, performed, and interpreted by Charlann Boxer D.O.  Knee: Moderate arthritic changes of the knee noted.  Patient does have some postsurgical changes of the medial meniscus noted.  Questionable degenerative tear noted at the free edge.  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 The above documentation has been reviewed and is accurate and complete Alroy Dust  Koren Bound, DO       Note: This dictation was prepared with Dragon dictation along with smaller phrase technology. Any transcriptional errors that result from this process are unintentional.

## 2019-08-21 NOTE — Telephone Encounter (Signed)
Pt was expecting a Vit D RX to be called into [facility] on [address] based on her visit this morning.

## 2019-08-21 NOTE — Patient Instructions (Addendum)
Good to see you.   Shoes  Hoka Bondi and  Newtons   pennsaid pinkie amount topically 2 times daily as needed.   Once weekly vitamin D for 12 weeks.   See me again in 5-6 weeks

## 2019-08-21 NOTE — Assessment & Plan Note (Signed)
Surgical intervention with patient also having probably moderate arthritic changes.  Discussed with patient in great length and patient elected to try conservative therapy.  Patient wants to stay active and given a hinged brace that I think will be beneficial.  Topical anti-inflammatories.  Discussed over-the-counter medications.  Home exercises and work with Event organiser.  Follow-up again in 4 to 8 weeks.  X-rays pending.  Worsening pain consider formal physical therapy and injection.

## 2019-09-25 ENCOUNTER — Other Ambulatory Visit: Payer: Self-pay

## 2019-09-25 ENCOUNTER — Encounter: Payer: Self-pay | Admitting: Family Medicine

## 2019-09-25 ENCOUNTER — Ambulatory Visit (INDEPENDENT_AMBULATORY_CARE_PROVIDER_SITE_OTHER): Payer: Managed Care, Other (non HMO) | Admitting: Family Medicine

## 2019-09-25 DIAGNOSIS — G8929 Other chronic pain: Secondary | ICD-10-CM | POA: Diagnosis not present

## 2019-09-25 DIAGNOSIS — M545 Low back pain, unspecified: Secondary | ICD-10-CM | POA: Insufficient documentation

## 2019-09-25 DIAGNOSIS — M999 Biomechanical lesion, unspecified: Secondary | ICD-10-CM | POA: Diagnosis not present

## 2019-09-25 MED ORDER — MELOXICAM 7.5 MG PO TABS: 7.5000 mg | ORAL_TABLET | Freq: Every day | ORAL | 0 refills | Status: DC

## 2019-09-25 NOTE — Assessment & Plan Note (Signed)

## 2019-09-25 NOTE — Progress Notes (Signed)
[name] [address] [place] [address] Phone: [phone] Subjective:   I Ronelle Nigh am serving as a Neurosurgeon for Dr. Antoine Primas.  This visit occurred during the SARS-CoV-2 public health emergency.  Safety protocols were in place, including screening questions prior to the visit, additional usage of staff PPE, and extensive cleaning of exam room while observing appropriate contact time as indicated for disinfecting solutions.   I'm seeing this patient by the request  of:  Lewis Moccasin, MD  CC: New back pain, knee pain follow-up  [id number]   08/21/2019 Surgical intervention with patient also having probably moderate arthritic changes.  Discussed with patient in great length and patient elected to try conservative therapy.  Patient wants to stay active and given a hinged brace that I think will be beneficial.  Topical anti-inflammatories.  Discussed over-the-counter medications.  Home exercises and work with Event organiser.  Follow-up again in 4 to 8 weeks.  X-rays pending.  Worsening pain consider formal physical therapy and injection.  09/25/2019 ALEXY BRINGLE is a 54 y.o. female coming in with complaint of left knee pain. Knee is feeling great. States she wears the brace with tennis and knows it is improving. New injury today. Playing tennis she twisted and felt a pain in her left SI joint. Left foot gets numb with machine activity.   Onset- 1 week ago  Location - Left SI joint  Duration- most painful with use  Character- dull, tightness,  Aggravating factors- flexion, sitting to standing for long periods of time Reliving factors-  Therapies tried- ice and heat (mostly) Severity- 7/10 at its worse      Past Medical History:  Diagnosis Date  . Allergy   . Gluten free diet   . Hives 2012   SAW DR. HICKS   Past Surgical History:  Procedure Laterality Date  . GYNECOLOGIC CRYOSURGERY  05/08/2006   cin 1  . lasix surgery    .  MENISCUS REPAIR  10/2009   Social History   Socioeconomic History  . Marital status: Married    Spouse name: Not on file  . Number of children: Not on file  . Years of education: Not on file  . Highest education level: Not on file  Occupational History  . Not on file  Tobacco Use  . Smoking status: Never Smoker  . Smokeless tobacco: Never Used  Substance and Sexual Activity  . Alcohol use: Yes    Alcohol/week: 0.0 standard drinks    Comment: occassional  . Drug use: No  . Sexual activity: Yes    Comment: INTERCOURSE AGE 23, SEXUAL PARTNERS MORE THAN 5  Other Topics Concern  . Not on file  Social History Narrative  . Not on file   Social Determinants of Health   Financial Resource Strain:   . Difficulty of Paying Living Expenses:   Food Insecurity:   . Worried About Programme researcher, broadcasting/film/video in the Last Year:   . Barista in the Last Year:   Transportation Needs:   . Freight forwarder (Medical):   Marland Kitchen Lack of Transportation (Non-Medical):   Physical Activity:   . Days of Exercise per Week:   . Minutes of Exercise per Session:   Stress:   . Feeling of Stress :   Social Connections:   . Frequency of Communication with Friends and Family:   . Frequency of Social Gatherings with Friends and Family:   . Attends  Religious Services:   . Active Member of Clubs or Organizations:   . Attends Archivist Meetings:   Marland Kitchen Marital Status:    Allergies  Allergen Reactions  . Almond (Diagnostic) Hives  . Barley Grass Hives  . Corn-Containing Products Hives  . Gluten Meal Hives  . Grapefruit Concentrate Hives  . Lemon Balm [Melissa Officinalis] Hives  . Macadamia Nut Oil Hives  . Oat Hives  . Orange (Diagnostic) Hives  . Peanut-Containing Drug Products Hives  . Pistachio Nut (Diagnostic) Hives  . Pollen Extract Hives  . Raspberry Hives  . Rice Hives  . Rye Grass Flower Pollen Extract [Gramineae Pollens] Hives  . Scallops [Shellfish Allergy] Hives  . Sesame  Seed (Diagnostic) Hives  . Soy Allergy Hives  . Strawberry (Diagnostic) Hives  . Tangerine Flavor Hives   Family History  Problem Relation Age of Onset  . Heart disease Father        died of MI  . Hypertension Father   . Allergic rhinitis Father   . Diabetes Maternal Grandmother   . Allergic rhinitis Mother   . Colon cancer Neg Hx   . Esophageal cancer Neg Hx   . Pancreatic cancer Neg Hx   . Rectal cancer Neg Hx   . Stomach cancer Neg Hx     Current Outpatient Medications (Endocrine & Metabolic):  .  NP THYROID 30 MG tablet, Take 30 mg by mouth every morning. Marland Kitchen  PROGESTERONE PO, 100 mg.   Current Outpatient Medications (Cardiovascular):  Marland Kitchen  EPINEPHrine 0.3 mg/0.3 mL IJ SOAJ injection, Inject into the muscle once.   Current Outpatient Medications (Respiratory):  .  levocetirizine (XYZAL) 5 MG tablet, Take 1 tablet (5 mg total) by mouth every evening. .  montelukast (SINGULAIR) 10 MG tablet, Take 1 tablet (10 mg total) by mouth at bedtime. .  triamcinolone (NASACORT AQ) 55 MCG/ACT AERO nasal inhaler, 1-2 sprays each nostril as needed for congestion.   Current Outpatient Medications (Analgesics):  .  meloxicam (MOBIC) 7.5 MG tablet, Take 1 tablet (7.5 mg total) by mouth daily.     Current Outpatient Medications (Other):  Marland Kitchen  OVER THE COUNTER MEDICATION, Fish Oil 750 mg. One tablet daily. Marland Kitchen  OVER THE COUNTER MEDICATION, Tumeric 100 mg one tablet daily. Marland Kitchen  OVER THE COUNTER MEDICATION, Magnesium Citrate 100 mg. One tablet daily. .  ranitidine (ZANTAC) 150 MG tablet, Take 1 tablet (150 mg total) by mouth 2 (two) times daily. .  Vitamin D, Ergocalciferol, (DRISDOL) 1.25 MG (50000 UNIT) CAPS capsule, Take 1 capsule (50,000 Units total) by mouth every 7 (seven) days.  Current Facility-Administered Medications (Other):  .  0.9 %  sodium chloride infusion   Reviewed prior external information including notes and imaging from  primary care provider As well as notes that were  available from care everywhere and other healthcare systems.  Past medical history, social, surgical and family history all reviewed in electronic medical record.  No pertanent information unless stated regarding to the chief complaint.   Review of Systems:  No headache, visual changes, nausea, vomiting, diarrhea, constipation, dizziness, abdominal pain, skin rash, fevers, chills, night sweats, weight loss, swollen lymph nodes, , joint swelling, chest pain, shortness of breath, mood changes. POSITIVE muscle aches, body aches  Objective  Blood pressure 108/60, pulse 96, height 5\' 3"  (1.6 m), weight 184 lb (83.5 kg), SpO2 96 %.   General: No apparent distress alert and oriented x3 mood and affect normal, dressed appropriately.  HEENT: Pupils  equal, extraocular movements intact  Respiratory: Patient's speak in full sentences and does not appear short of breath  Cardiovascular: No lower extremity edema, non tender, no erythema  Neuro: Cranial nerves II through XII are intact, neurovascularly intact in all extremities with 2+ DTRs and 2+ pulses.  Gait normal with good balance and coordination.  MSK:  Non tender with full range of motion and good stability and symmetric strength and tone of shoulders, elbows, wrist, hip, knee and ankles bilaterally.  Low back exam shows the patient does have tenderness to palpation mostly over the left sacroiliac joint.  Patient does have a positive Pearlean Brownie on the left.  Negative straight leg test.  Patient does have some mild limited rotation to the left compared to the right.  Osteopathic findings  T9 extended rotated and side bent left L1 flexed rotated and side bent right Sacrum left on left    Impression and Recommendations:     This case required medical decision making of moderate complexity. The above documentation has been reviewed and is accurate and complete Judi Saa, DO       Note: This dictation was prepared with Dragon dictation along  with smaller phrase technology. Any transcriptional errors that result from this process are unintentional.

## 2019-09-25 NOTE — Assessment & Plan Note (Signed)
Appears to be more sacroiliac.  Patient given hip abductor strengthening and will be stretching the hip flexors.  Once weekly vitamin D encouraged for muscle strength and endurance.  Continue meloxicam for any breakthrough pain.  Icing regimen.  Spine well to manipulation.  Follow-up again in 4 to 6 weeks

## 2019-09-25 NOTE — Patient Instructions (Addendum)
Meloxicam sent in SI joint exercises See me again in 4-5 weeks

## 2019-10-21 ENCOUNTER — Encounter (INDEPENDENT_AMBULATORY_CARE_PROVIDER_SITE_OTHER): Payer: Self-pay | Admitting: Family Medicine

## 2019-10-21 ENCOUNTER — Ambulatory Visit (INDEPENDENT_AMBULATORY_CARE_PROVIDER_SITE_OTHER): Payer: Managed Care, Other (non HMO) | Admitting: Family Medicine

## 2019-10-21 ENCOUNTER — Other Ambulatory Visit: Payer: Self-pay

## 2019-10-21 VITALS — BP 115/77 | HR 73 | Temp 98.2°F | Ht 64.0 in | Wt 179.0 lb

## 2019-10-21 DIAGNOSIS — R5383 Other fatigue: Secondary | ICD-10-CM | POA: Diagnosis not present

## 2019-10-21 DIAGNOSIS — E559 Vitamin D deficiency, unspecified: Secondary | ICD-10-CM | POA: Diagnosis not present

## 2019-10-21 DIAGNOSIS — E538 Deficiency of other specified B group vitamins: Secondary | ICD-10-CM

## 2019-10-21 DIAGNOSIS — Z9189 Other specified personal risk factors, not elsewhere classified: Secondary | ICD-10-CM | POA: Diagnosis not present

## 2019-10-21 DIAGNOSIS — E7849 Other hyperlipidemia: Secondary | ICD-10-CM | POA: Diagnosis not present

## 2019-10-21 DIAGNOSIS — R0602 Shortness of breath: Secondary | ICD-10-CM

## 2019-10-21 DIAGNOSIS — E669 Obesity, unspecified: Secondary | ICD-10-CM

## 2019-10-21 DIAGNOSIS — F3289 Other specified depressive episodes: Secondary | ICD-10-CM | POA: Diagnosis not present

## 2019-10-21 DIAGNOSIS — Z6830 Body mass index (BMI) 30.0-30.9, adult: Secondary | ICD-10-CM

## 2019-10-21 DIAGNOSIS — Z8249 Family history of ischemic heart disease and other diseases of the circulatory system: Secondary | ICD-10-CM

## 2019-10-21 DIAGNOSIS — Z0289 Encounter for other administrative examinations: Secondary | ICD-10-CM

## 2019-10-21 NOTE — Progress Notes (Signed)
Chief Complaint:   Betty Price (MR# [id number]) is a 54 y.o. female who presents for evaluation and treatment of Betty and related comorbidities. Current BMI is Body mass index is 30.73 kg/m. Betty Price has been struggling with her weight for many years and has been unsuccessful in either losing weight, maintaining weight loss, or reaching her healthy weight goal.  Betty Price is currently in the action stage of change and ready to dedicate time achieving and maintaining a healthier weight. Betty Price is interested in becoming our patient and working on intensive lifestyle modifications including (but not limited to) diet and exercise for weight loss.  Betty Price's entire history, ROS, weight history, nutritional evaluation, mood and food, and sleep history were reviewed with the patient today in office.  Betty Price's habits were reviewed today and are as follows: Her family eats meals together, she thinks her family will eat healthier with her, her desired weight loss is 31 pounds, she has been heavy most of her life, she started gaining weight after she had a baby, her heaviest weight ever was her current weight, she craves cheese and red wine, she snacks frequently in the evenings, she skips meals frequently, she is frequently drinking liquids with calories, she frequently eats larger portions than normal and she struggles with emotional eating.  Depression Screen Betty Price's Food and Mood (modified PHQ-9) score was 4.  Depression screen [facility] 2/9 10/21/2019  Decreased Interest 0  Down, Depressed, Hopeless 3  PHQ - 2 Score 3  Altered sleeping 0  Tired, decreased energy 0  Change in appetite 1  Feeling bad or failure about yourself  0  Trouble concentrating 0  Moving slowly or fidgety/restless 0  Suicidal thoughts 0  PHQ-9 Score 4  Difficult doing work/chores Not difficult at all   Subjective:   1. Other fatigue Gary admits to daytime somnolence and reports waking up still tired. Patent has a history of  symptoms of daytime fatigue, morning fatigue and snoring. Betty Price generally gets 6 hours of sleep per night, and states that she has generally restful sleep. Snoring is present. Apneic episodes are not present. Epworth Sleepiness Score is 6.  2. SOB (shortness of breath) on exertion Vanda notes increasing shortness of breath with exercising and seems to be worsening over time with weight gain. She notes getting out of breath sooner with activity than she used to. This has gotten worse recently. Betty Price denies shortness of breath at rest or orthopnea.  3. Other hyperlipidemia Betty Price has hyperlipidemia and has been trying to improve her cholesterol levels with intensive lifestyle modification including a low saturated fat diet, exercise and weight loss. She denies any chest pain, claudication or myalgias.  Last labs in chart were in July of 2019.  Lab Results  Component Value Date   ALT 25 09/04/2016   AST 18 09/04/2016   ALKPHOS 43 09/04/2016   BILITOT 0.3 09/04/2016   Lab Results  Component Value Date   CHOL 252 (H) 11/06/2017   HDL 77 11/06/2017   LDLCALC 150 (H) 11/06/2017   TRIG 123 11/06/2017   CHOLHDL 3.3 11/06/2017   4. Vitamin D deficiency She is currently taking prescription vitamin D 50,000 IU each week. She denies nausea, vomiting or muscle weakness.  5. B12 deficiency She notes fatigue. She is not a vegetarian.  She does not have a previous diagnosis of pernicious anemia.  She does not have a history of weight loss surgery.  She is currently taking a daily B12 supplement.  6. Family history of early CAD Positive family history of early CAD in her father.  He had his first AMI at age 40.    43. Other depression with emotional eating  Betty Price is struggling with emotional eating and using food for comfort to the extent that it is negatively impacting her health. She has been working on behavior modification techniques to help reduce her emotional eating and has been unsuccessful. She  shows no sign of suicidal or homicidal ideations.  Her weight makes her feel "down" almost everyday.  No SI, and does not feel "depressed", but feels poorly about her weight.  8. At risk for heart disease Betty Price is at a higher than average risk for cardiovascular disease due to her family history of AMI.   Assessment/Plan:   1. Other fatigue Betty Price does feel that her weight is causing her energy to be lower than it should be. Fatigue may be related to Betty, depression or many other causes. Labs will be ordered, and in the meanwhile, Betty Price will focus on self care including making healthy food choices, increasing physical activity and focusing on stress reduction. - EKG 12-Lead - Folate - T3 - T4, free - TSH  2. SOB (shortness of breath) on exertion Betty Price does feel that she gets out of breath more easily that she used to when she exercises. Betty Price's shortness of breath appears to be Betty related and exercise induced. She has agreed to work on weight loss and gradually increase exercise to treat her exercise induced shortness of breath. Will continue to monitor closely. - Folate - T3 - T4, free - TSH  3. Other hyperlipidemia Cardiovascular risk and specific lipid/LDL goals reviewed.  We discussed several lifestyle modifications today and Betty Price will continue to work on diet, exercise and weight loss efforts. Orders and follow up as documented in patient record.  Check labs; prudent diet, weight loss.   Counseling Intensive lifestyle modifications are the first line treatment for this issue. . Dietary changes: Increase soluble fiber. Decrease simple carbohydrates. . Exercise changes: Moderate to vigorous-intensity aerobic activity 150 minutes per week if tolerated. . Lipid-lowering medications: see documented in medical record. - Comprehensive metabolic panel - CBC with Differential/Platelet - Hemoglobin A1c - Insulin, random - Lipid Panel With LDL/HDL Ratio  4. Vitamin D deficiency Low  Vitamin D level contributes to fatigue and are associated with Betty, breast, and colon cancer. She agrees to continue to take prescription Vitamin D @50 ,000 IU every week and will follow-up for routine testing of Vitamin D, at least 2-3 times per year to avoid over-replacement.  Will check labs today and Betty Price will work on her diet and weight loss. - VITAMIN D 25 Hydroxy (Vit-D Deficiency, Fractures)  5. B12 deficiency The diagnosis was reviewed with the patient. Counseling provided today, see below. We will continue to monitor. Orders and follow up as documented in patient record.  Counseling . The body needs vitamin B12: to make red blood cells; to make DNA; and to help the nerves work properly so they can carry messages from the brain to the body.  . The main causes of vitamin B12 deficiency include dietary deficiency, digestive diseases, pernicious anemia, and having a surgery in which part of the stomach or small intestine is removed.  . Certain medicines can make it harder for the body to absorb vitamin B12. These medicines include: heartburn medications; some antibiotics; some medications used to treat diabetes, gout, and high cholesterol.  . In some cases, there are  no symptoms of this condition. If the condition leads to anemia or nerve damage, various symptoms can occur, such as weakness or fatigue, shortness of breath, and numbness or tingling in your hands and feet.   . Treatment:  o May include taking vitamin B12 supplements.  o Avoid alcohol.  o Eat lots of healthy foods that contain vitamin B12: - Beef, pork, chicken, [place], and organ meats, such as liver.  - Seafood: This includes clams, rainbow trout, salmon, tuna, and haddock. Eggs.  - Cereal and dairy products that are fortified: This means that vitamin B12 has been added to the food.  - Vitamin B12  6. Family history of early CAD Will check labs today and Betty Price will focus on a prudent diet and weight loss.  Will  follow.  7. Other depression with emotional eating  Patient was referred to Dr. Dewaine Conger, our Bariatric Psychologist, for evaluation due to her elevated PHQ-9 score and significant struggles with emotional eating.  8. At risk for heart disease Betty Price was given approximately 15 minutes of coronary artery disease prevention counseling today. She is 54 y.o. female and has risk factors for heart disease including Betty. We discussed intensive lifestyle modifications today with an emphasis on specific weight loss instructions and strategies.   Repetitive spaced learning was employed today to elicit superior memory formation and behavioral change.  9. Class 1 Betty with serious comorbidity and body mass index (BMI) of 30.0 to 30.9 in adult, unspecified Betty type Betty Price is currently in the action stage of change and her goal is to continue with weight loss efforts. I recommend Betty Price begin the structured treatment plan as follows:  She has agreed to the Category 2 Plan.  Exercise goals: As is.   Behavioral modification strategies: increasing water intake, no skipping meals and planning for success.  She was informed of the importance of frequent follow-up visits to maximize her success with intensive lifestyle modifications for her multiple health conditions. She was informed we would discuss her lab results at her next visit unless there is a critical issue that needs to be addressed sooner. Betty Price agreed to keep her next visit at the agreed upon time to discuss these results.  Objective:   Blood pressure 115/77, pulse 73, temperature 98.2 F (36.8 C), temperature source Oral, height 5\' 4"  (1.626 m), weight 179 lb (81.2 kg), SpO2 98 %. Body mass index is 30.73 kg/m.  EKG: Normal sinus rhythm, rate 56 bpm.  Indirect Calorimeter completed today shows a VO2 of 155 and a REE of 1081.  Her calculated basal metabolic rate is thus her basal metabolic rate is worse than expected.  General:  Cooperative, alert, well developed, in no acute distress. HEENT: Conjunctivae and lids unremarkable. Cardiovascular: Regular rhythm.  Lungs: Normal work of breathing. Neurologic: No focal deficits.   Lab Results  Component Value Date   CREATININE 1.03 09/04/2016   BUN 15 09/04/2016   NA 139 09/04/2016   K 4.1 09/04/2016   CL 106 09/04/2016   CO2 20 09/04/2016   Lab Results  Component Value Date   ALT 25 09/04/2016   AST 18 09/04/2016   ALKPHOS 43 09/04/2016   BILITOT 0.3 09/04/2016   Lab Results  Component Value Date   HGBA1C 5.4 11/06/2017   HGBA1C 5.1 09/04/2016   HGBA1C 5.4 07/23/2015   HGBA1C 5.6 07/21/2014   HGBA1C 5.4 07/01/2013   Lab Results  Component Value Date   TSH 2.32 09/04/2016   Lab Results  Component Value Date   CHOL 252 (H) 11/06/2017   HDL 77 11/06/2017   LDLCALC 150 (H) 11/06/2017   TRIG 123 11/06/2017   CHOLHDL 3.3 11/06/2017   Lab Results  Component Value Date   WBC 5.6 09/04/2016   HGB 13.0 09/04/2016   HCT 39.6 09/04/2016   MCV 96.8 09/04/2016   PLT 312 09/04/2016   Attestation Statements:   Reviewed by clinician on day of visit: allergies, medications, problem list, medical history, surgical history, family history, social history, and previous encounter notes.  I, Insurance claims handler, CMA, am acting as Energy manager for [employer], DO.  I have reviewed the above documentation for accuracy and completeness, and I agree with the above. Thomasene Lot, DO

## 2019-10-21 NOTE — Progress Notes (Signed)
Office: [phone]  /  Fax: [phone]    Date: October 27, 2019   Appointment Start Time: 11:01am Duration: 27 minutes Provider: Lawerance Cruel, Psy.D. Type of Session: Intake for Individual Therapy  Location of Patient: Home Location of Provider: [facility] Type of Contact: Telepsychological Visit via MyChart Video Visit  Informed Consent: Prior to proceeding with today's appointment, two pieces of identifying information were obtained. In addition, Xochilth's physical location at the time of this appointment was obtained as well a phone number she could be reached at in the event of technical difficulties. Loma and this provider participated in today's telepsychological service.   The provider's role was explained to Erven Colla. The provider reviewed and discussed issues of confidentiality, privacy, and limits therein (e.g., reporting obligations). In addition to verbal informed consent, written informed consent for psychological services was obtained prior to the initial appointment. Since the clinic is not a 24/7 crisis center, mental health emergency resources were shared and this  provider explained MyChart, e-mail, voicemail, and/or other messaging systems should be utilized only for non-emergency reasons. This provider also explained that information obtained during appointments will be placed in Nakota's medical record and relevant information will be shared with other providers at [facility] for coordination of care. Moreover, Ekam agreed information may be shared with other [facility] providers as needed for coordination of care. By signing the service agreement document, Srinidhi provided written consent for coordination of care. Prior to initiating telepsychological services, Jamese completed an informed consent document, which included the development of a safety plan (i.e., an emergency contact, nearest emergency room, and emergency resources)  in the event of an emergency/crisis. Oktober expressed understanding of the rationale of the safety plan. Chanah verbally acknowledged understanding she is ultimately responsible for understanding her insurance benefits for telepsychological and in-person services. This provider also reviewed confidentiality, as it relates to telepsychological services, as well as the rationale for telepsychological services (i.e., to reduce exposure risk to COVID-19). Sady  acknowledged understanding that appointments cannot be recorded without both party consent and she is aware she is responsible for securing confidentiality on her end of the session. Shanice verbally consented to proceed.  Chief Complaint/HPI: Elliott was referred by Dr. Thomasene Lot due to other depression, with emotional eating. Per the note for the initial visit with Dr. Thomasene Lot on October 21, 2019, "Analisa is struggling with emotional eating and using food for comfort to the extent that it is negatively impacting her health. She has been working on behavior modification techniques to help reduce her emotional eating and has been unsuccessful. She shows no sign of suicidal or homicidal ideations. Her weight makes her feel "down" almost everyday.  No SI, and does not feel "depressed", but feels poorly about her weight." The note for the initial appointment with Dr. Thomasene Lot indicated the following: "Chamari's habits were reviewed today and are as follows: Her family eats meals together, she thinks her family will eat healthier with her, her desired weight loss is 31 pounds, she has been heavy most of her life, she started gaining weight after she had a baby, her heaviest weight ever was her current weight, she craves cheese and red wine, she snacks frequently in the evenings, she skips meals frequently, she is frequently drinking liquids with calories, she frequently eats larger portions than normal and she struggles with emotional eating." Zniya's Food and  Mood (modified PHQ-9) score on October 21, 2019 was  4.  During today's appointment, Azelea was verbally administered a questionnaire assessing various behaviors related to emotional eating. Sativa endorsed the following: eat certain foods when you are anxious, stressed, depressed, or your feelings are hurt, find food is comforting to you, overeat frequently when you are bored or lonely and overeat when you are alone, but eat much less when you are with other people. She shared she craves salty foods (e.g., cheese). Tajha stated she is unsure about the onset of emotional eating and described the current frequency of emotional eating as "daily." In addition, Jayni denied a history of binge eating. Kayra denied a history of restricting food intake, purging and engagement in other compensatory strategies, and has never been diagnosed with an eating disorder. She also denied a history of treatment for emotional eating. Moreover, Jeda indicated work stress triggers emotional eating, whereas playing tennis makes emotional eating better. Furthermore, Tanija denied other problems of concern.    Mental Status Examination:  Appearance: well groomed and appropriate hygiene  Behavior: appropriate to circumstances Mood: euthymic Affect: mood congruent Speech: normal in rate, volume, and tone Eye Contact: appropriate Psychomotor Activity: appropriate Gait: unable to assess Thought Process: linear, logical, and goal directed  Thought Content/Perception: denies suicidal and homicidal ideation, plan, and intent and no hallucinations, delusions, bizarre thinking or behavior reported or observed Orientation: time, person, place, and purpose of appointment Memory/Concentration: memory, attention, language, and fund of knowledge intact  Insight/Judgment: good  Family & Psychosocial History: Emogene reported she is married and she has a daughter (age 78). 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 Additionally,  Timmi shared her highest level of education obtained is an associate's degree. Currently, Averly's social support system consists of her husband, daughter, older sister, grand puppy, and two "very, very" close friends. Moreover, Benita stated she resides with her husband, daughter, and grand puppy.   Medical History:  Past Medical History:  Diagnosis Date  . Acute lateral meniscus tear of left knee   . Allergy   . Back pain   . Emotional stress   . Food allergy   . Gluten free diet   . Hives 2012   SAW DR. HICKS  . Joint pain    Past Surgical History:  Procedure Laterality Date  . GYNECOLOGIC CRYOSURGERY  05/08/2006   cin 1  . lasix surgery    . MENISCUS REPAIR  10/2009   in 2014 left knee   Current Outpatient Medications on File Prior to Visit  Medication Sig Dispense Refill  . EPINEPHrine 0.3 mg/0.3 mL IJ SOAJ injection Inject into the muscle once.    Marland Kitchen levocetirizine (XYZAL) 5 MG tablet Take 1 tablet (5 mg total) by mouth every evening. 30 tablet 5  . meloxicam (MOBIC) 7.5 MG tablet Take 1 tablet (7.5 mg total) by mouth daily. 30 tablet 0  . montelukast (SINGULAIR) 10 MG tablet Take 1 tablet (10 mg total) by mouth at bedtime. 30 tablet 5  . NP THYROID 30 MG tablet Take 30 mg by mouth every morning.    Marland Kitchen OVER THE COUNTER MEDICATION Fish Oil 750 mg. One tablet daily.    Marland Kitchen OVER THE COUNTER MEDICATION Tumeric 100 mg one tablet daily.    Marland Kitchen OVER THE COUNTER MEDICATION Magnesium Citrate 100 mg. One tablet daily.    Marland Kitchen PROGESTERONE PO 100 mg.    . ranitidine (ZANTAC) 150 MG tablet Take 1 tablet (150 mg total) by mouth 2 (two) times daily. 60 tablet 5  .  triamcinolone (NASACORT AQ) 55 MCG/ACT AERO nasal inhaler 1-2 sprays each nostril as needed for congestion. 1 Inhaler 5  . Vitamin D, Ergocalciferol, (DRISDOL) 1.25 MG (50000 UNIT) CAPS capsule Take 1 capsule (50,000 Units total) by mouth every 7 (seven) days. 12 capsule 0   Current Facility-Administered Medications on File Prior to Visit    Medication Dose Route Frequency Provider Last Rate Last Admin  . 0.9 %  sodium chloride infusion  500 mL Intravenous Once [name], Carie Caddy, MD      Leana denied a history of head injuries and loss of consciousness.    Mental Health History: Narka reported she attended marital counseling during her first marriage in the early 2000s. She denied a history of psychiatric services. Jayleana reported there is no history of hospitalizations for psychiatric concerns. Mckinley denied a family history of mental health related concerns. Jahzara reported there is no history of trauma including psychological, physical  and sexual abuse, as well as neglect.   Arlee described her typical mood lately as "great." Lakiyah endorsed consuming alcohol socially. She denied tobacco use. She denied illicit/recreational substance use. Regarding caffeine intake, Saphire reported consuming two cups of coffee daily. Furthermore, Edla indicated she is not experiencing the following: hallucinations and delusions, paranoia, symptoms of mania , social withdrawal, crying spells, panic attacks and decreased motivation. She also denied history of and current suicidal ideation, plan, and intent; history of and current homicidal ideation, plan, and intent; and history of and current engagement in self-harm.  The following strengths were reported by Noreene Larsson: very positive, independent, good listener, multi tasker, good Clinical research associate, good mother, good wife, and good friend. The following strengths were observed by this provider: ability to express thoughts and feelings during the therapeutic session, ability to establish and benefit from a therapeutic relationship, willingness to work toward established goal(s) with the clinic and ability to engage in reciprocal conversation.   Legal History: Takelia reported there is no history of legal involvement.   Structured Assessments Results: The Patient Health Questionnaire-9 (PHQ-9) is a self-report measure that assesses symptoms  and severity of depression over the course of the last two weeks. Spencer obtained a score of 2 suggesting minimal depression. Delvina finds the endorsed symptoms to be somewhat difficult. [0= Not at all; 1= Several days; 2= More than half the days; 3= Nearly every day] Little interest or pleasure in doing things 0  Feeling down, depressed, or hopeless 0  Trouble falling or staying asleep, or sleeping too much 0  Feeling tired or having little energy 0  Poor appetite or overeating 2  Feeling bad about yourself --- or that you are a failure or have let yourself or your family down 0  Trouble concentrating on things, such as reading the newspaper or watching television 0  Moving or speaking so slowly that other people could have noticed? Or the opposite --- being so fidgety or restless that you have been moving around a lot more than usual 0  Thoughts that you would be better off dead or hurting yourself in some way 0  PHQ-9 Score 2    The Generalized Anxiety Disorder-7 (GAD-7) is a brief self-report measure that assesses symptoms of anxiety over the course of the last two weeks. Elka obtained a score of 0. [0= Not at all; 1= Several days; 2= Over half the days; 3= Nearly every day] Feeling nervous, anxious, on edge 0  Not being able to stop or control worrying 0  Worrying too much about  different things 0  Trouble relaxing 0  Being so restless that it's hard to sit still 0  Becoming easily annoyed or irritable 0  Feeling afraid as if something awful might happen 0  GAD-7 Score 0   Interventions:  Conducted a chart review Focused on rapport building Verbally administered PHQ-9 and GAD-7 for symptom monitoring Provided emphatic reflections and validation Psychoeducation provided regarding physical versus emotional hunger  Provisional DSM-5 Diagnosis(es): 307.59 (F50.8) Other Specified Feeding or Eating Disorder, Emotional Eating Behaviors  Plan:Mailin reported desire to think about continuing  therapeutic services at this time. She acknowledged understanding that she may request a follow-up appointment with this provider in the future as long as she is still established with the clinic. Harrison will be sent a handout via e-mail to increase awareness of hunger patterns and subsequent eating. Saleah provided verbal consent during today's appointment for this provider to send the handout via e-mail. No further follow-up planned by this provider.

## 2019-10-22 LAB — CBC WITH DIFFERENTIAL/PLATELET
Basophils Absolute: 0.1 10*3/uL (ref 0.0–0.2)
Basos: 2 %
EOS (ABSOLUTE): 0.2 10*3/uL (ref 0.0–0.4)
Eos: 4 %
Hematocrit: 39 % (ref 34.0–46.6)
Hemoglobin: 13.4 g/dL (ref 11.1–15.9)
Immature Grans (Abs): 0 10*3/uL (ref 0.0–0.1)
Immature Granulocytes: 0 %
Lymphocytes Absolute: 1.6 10*3/uL (ref 0.7–3.1)
Lymphs: 42 %
MCH: 32.4 pg (ref 26.6–33.0)
MCHC: 34.4 g/dL (ref 31.5–35.7)
MCV: 94 fL (ref 79–97)
Monocytes Absolute: 0.4 10*3/uL (ref 0.1–0.9)
Monocytes: 12 %
Neutrophils Absolute: 1.5 10*3/uL (ref 1.4–7.0)
Neutrophils: 40 %
Platelets: 245 10*3/uL (ref 150–450)
RBC: 4.13 x10E6/uL (ref 3.77–5.28)
RDW: 13 % (ref 11.7–15.4)
WBC: 3.7 10*3/uL (ref 3.4–10.8)

## 2019-10-22 LAB — COMPREHENSIVE METABOLIC PANEL
ALT: 21 IU/L (ref 0–32)
AST: 20 IU/L (ref 0–40)
Albumin/Globulin Ratio: 2 (ref 1.2–2.2)
Albumin: 4.7 g/dL (ref 3.8–4.9)
Alkaline Phosphatase: 92 IU/L (ref 48–121)
BUN/Creatinine Ratio: 20 (ref 9–23)
BUN: 18 mg/dL (ref 6–24)
Bilirubin Total: 0.5 mg/dL (ref 0.0–1.2)
CO2: 22 mmol/L (ref 20–29)
Calcium: 9.7 mg/dL (ref 8.7–10.2)
Chloride: 104 mmol/L (ref 96–106)
Creatinine, Ser: 0.91 mg/dL (ref 0.57–1.00)
GFR calc Af Amer: 83 mL/min/{1.73_m2} (ref >59)
GFR calc non Af Amer: 72 mL/min/{1.73_m2} (ref >59)
Globulin, Total: 2.3 g/dL (ref 1.5–4.5)
Glucose: 113 mg/dL — ABNORMAL HIGH (ref 65–99)
Potassium: 4.8 mmol/L (ref 3.5–5.2)
Sodium: 140 mmol/L (ref 134–144)
Total Protein: 7 g/dL (ref 6.0–8.5)

## 2019-10-22 LAB — VITAMIN B12: Vitamin B-12: 656 pg/mL (ref 232–1245)

## 2019-10-22 LAB — LIPID PANEL WITH LDL/HDL RATIO
Cholesterol, Total: 237 mg/dL — ABNORMAL HIGH (ref 100–199)
HDL: 76 mg/dL (ref >39)
LDL Chol Calc (NIH): 149 mg/dL — ABNORMAL HIGH (ref 0–99)
LDL/HDL Ratio: 2 ratio (ref 0.0–3.2)
Triglycerides: 72 mg/dL (ref 0–149)
VLDL Cholesterol Cal: 12 mg/dL (ref 5–40)

## 2019-10-22 LAB — TSH: TSH: 1.06 u[IU]/mL (ref 0.450–4.500)

## 2019-10-22 LAB — T4, FREE: Free T4: 1.33 ng/dL (ref 0.82–1.77)

## 2019-10-22 LAB — HEMOGLOBIN A1C
Est. average glucose Bld gHb Est-mCnc: 114 mg/dL
Hgb A1c MFr Bld: 5.6 % (ref 4.8–5.6)

## 2019-10-22 LAB — VITAMIN D 25 HYDROXY (VIT D DEFICIENCY, FRACTURES): Vit D, 25-Hydroxy: 47.1 ng/mL (ref 30.0–100.0)

## 2019-10-22 LAB — T3: T3, Total: 158 ng/dL (ref 71–180)

## 2019-10-22 LAB — INSULIN, RANDOM: INSULIN: 9.7 u[IU]/mL (ref 2.6–24.9)

## 2019-10-22 LAB — FOLATE: Folate: 18.6 ng/mL (ref >3.0)

## 2019-10-23 ENCOUNTER — Encounter: Payer: Self-pay | Admitting: Family Medicine

## 2019-10-23 ENCOUNTER — Ambulatory Visit (INDEPENDENT_AMBULATORY_CARE_PROVIDER_SITE_OTHER): Payer: Managed Care, Other (non HMO) | Admitting: Family Medicine

## 2019-10-23 ENCOUNTER — Ambulatory Visit (INDEPENDENT_AMBULATORY_CARE_PROVIDER_SITE_OTHER): Payer: Managed Care, Other (non HMO)

## 2019-10-23 ENCOUNTER — Other Ambulatory Visit: Payer: Self-pay

## 2019-10-23 VITALS — BP 118/74 | HR 77 | Ht 64.0 in | Wt 181.0 lb

## 2019-10-23 DIAGNOSIS — M23204 Derangement of unspecified medial meniscus due to old tear or injury, left knee: Secondary | ICD-10-CM

## 2019-10-23 DIAGNOSIS — M545 Low back pain, unspecified: Secondary | ICD-10-CM

## 2019-10-23 DIAGNOSIS — M999 Biomechanical lesion, unspecified: Secondary | ICD-10-CM

## 2019-10-23 NOTE — Patient Instructions (Signed)
Vit D 4000IU daily for one month, then 2000IU daily there after PT [facility] [place] See me in 6-8 weeks

## 2019-10-23 NOTE — Assessment & Plan Note (Signed)
Low back pain as well.  May help with physical therapy.  Patient is making progress.  Encourage weight loss.  Patient is dedicated and motivated at this time that I do believe will have good outcome.  Follow-up again in 4 to 8 weeks.

## 2019-10-23 NOTE — Assessment & Plan Note (Signed)
Chronic problem patient is still having difficulty with.  We discussed medications including the gabapentin.  Discussed which activities to do which wants to avoid.  Increase activity slowly.  Discussed the meloxicam.  Sent to formal physical therapy

## 2019-10-23 NOTE — Progress Notes (Signed)
[name] [address] [place] [address] Phone: [phone] Subjective:   Betty Price, am serving as a scribe for Dr. Antoine Primas. This visit occurred during the SARS-CoV-2 public health emergency.  Safety protocols were in place, including screening questions prior to the visit, additional usage of staff PPE, and extensive cleaning of exam room while observing appropriate contact time as indicated for disinfecting solutions.   I'm seeing this patient by the request  of:  Lewis Moccasin, MD  CC: Back pain and knee pain follow-up  HER:[id number]   09/25/2019 Appears to be more sacroiliac.  Patient given hip abductor strengthening and will be stretching the hip flexors.  Once weekly vitamin D encouraged for muscle strength and endurance.  Continue meloxicam for any breakthrough pain.  Icing regimen.  Spine well to manipulation.  Follow-up again in 4 to 6 weeks  Update 10/23/2019 Betty Price is a 54 y.o. female coming in with complaint of back and neck pain. OMT 09/25/2019. Patient states that her back pain improved after the adjustment. Continues to have pain in SI joints when she bends down to pick up a ball she will experience a catching sensation. Left SI joint worse than right when flexion.   Medications patient has been prescribed: Vitamin D, meloxicam  Taking: As above         Reviewed prior external information including notes and imaging from previsou exam, outside providers and external EMR if available.   As well as notes that were available from care everywhere and other healthcare systems.  Past medical history, social, surgical and family history all reviewed in electronic medical record.  No pertanent information unless stated regarding to the chief complaint.   Past Medical History:  Diagnosis Date  . Acute lateral meniscus tear of left knee   . Allergy   . Back pain   . Emotional stress   . Food allergy   . Gluten  free diet   . Hives 2012   SAW DR. HICKS  . Joint pain     Allergies  Allergen Reactions  . Almond (Diagnostic) Hives  . Barley Grass Hives  . Corn-Containing Products Hives  . Gluten Meal Hives  . Grapefruit Concentrate Hives  . Lemon Balm [Melissa Officinalis] Hives  . Macadamia Nut Oil Hives  . Oat Hives  . Orange (Diagnostic) Hives  . Peanut-Containing Drug Products Hives  . Pistachio Nut (Diagnostic) Hives  . Pollen Extract Hives  . Raspberry Hives  . Rice Hives  . Rye Grass Flower Pollen Extract [Gramineae Pollens] Hives  . Scallops [Shellfish Allergy] Hives  . Sesame Seed (Diagnostic) Hives  . Soy Allergy Hives  . Strawberry (Diagnostic) Hives  . Tangerine Flavor Hives     Review of Systems:  No headache, visual changes, nausea, vomiting, diarrhea, constipation, dizziness, abdominal pain, skin rash, fevers, chills, night sweats, weight loss, swollen lymph nodes, body aches, joint swelling, chest pain, shortness of breath, mood changes. POSITIVE muscle aches  Objective  Blood pressure 118/74, pulse 77, height 5\' 4"  (1.626 m), weight 181 lb (82.1 kg), SpO2 99 %.   General: No apparent distress alert and oriented x3 mood and affect normal, dressed appropriately.  HEENT: Pupils equal, extraocular movements intact  Respiratory: Patient's speak in full sentences and does not appear short of breath  Cardiovascular: No lower extremity edema, non tender, no erythema  Neuro: Cranial nerves II through XII are intact, neurovascularly intact in all extremities with 2+  DTRs and 2+ pulses.  Gait normal with good balance and coordination.  MSK:  Left knee exam shows the patient does have some mild lateral tracking of the patella noted.  Mild patella grind test noted.  Negative McMurray's today which is an improvement.  Full range of motion.  Still tender over the medial joint line no.  Back exam shows tenderness over the sacroiliac joints bilaterally right greater than left.   Tender to palpation also in the parascapular region laterally.  Negative straight leg test, mild tightness with FABER test bilaterally  Osteopathic findings   T7 extended rotated and side bent left L1 flexed rotated and side bent left Sacrum right on right       Assessment and Plan:  Degenerative tear of medial meniscus, left Chronic problem patient is still having difficulty with.  We discussed medications including the gabapentin.  Discussed which activities to do which wants to avoid.  Increase activity slowly.  Discussed the meloxicam.  Sent to formal physical therapy  Low back pain Low back pain as well.  May help with physical therapy.  Patient is making progress.  Encourage weight loss.  Patient is dedicated and motivated at this time that I do believe will have good outcome.  Follow-up again in 4 to 8 weeks.    Nonallopathic problems  Decision today to treat with OMT was based on Physical Exam  After verbal consent patient was treated with HVLA, ME, FPR techniques in cervical, rib, thoracic, lumbar, and sacral  areas  Patient tolerated the procedure well with improvement in symptoms  Patient given exercises, stretches and lifestyle modifications  See medications in patient instructions if given  Patient will follow up in 4-8 weeks      The above documentation has been reviewed and is accurate and complete Lyndal Pulley, DO       Note: This dictation was prepared with Dragon dictation along with smaller phrase technology. Any transcriptional errors that result from this process are unintentional.

## 2019-10-27 ENCOUNTER — Other Ambulatory Visit: Payer: Self-pay

## 2019-10-27 ENCOUNTER — Telehealth (INDEPENDENT_AMBULATORY_CARE_PROVIDER_SITE_OTHER): Payer: Managed Care, Other (non HMO) | Admitting: Psychology

## 2019-10-27 DIAGNOSIS — F5089 Other specified eating disorder: Secondary | ICD-10-CM

## 2019-10-30 ENCOUNTER — Encounter (INDEPENDENT_AMBULATORY_CARE_PROVIDER_SITE_OTHER): Payer: Self-pay

## 2019-11-04 ENCOUNTER — Encounter (INDEPENDENT_AMBULATORY_CARE_PROVIDER_SITE_OTHER): Payer: Self-pay | Admitting: Family Medicine

## 2019-11-04 ENCOUNTER — Ambulatory Visit (INDEPENDENT_AMBULATORY_CARE_PROVIDER_SITE_OTHER): Payer: Managed Care, Other (non HMO) | Admitting: Family Medicine

## 2019-11-04 ENCOUNTER — Other Ambulatory Visit: Payer: Self-pay

## 2019-11-04 VITALS — BP 114/74 | HR 65 | Temp 98.0°F | Ht 64.0 in | Wt 178.0 lb

## 2019-11-04 DIAGNOSIS — E669 Obesity, unspecified: Secondary | ICD-10-CM

## 2019-11-04 DIAGNOSIS — E7849 Other hyperlipidemia: Secondary | ICD-10-CM | POA: Diagnosis not present

## 2019-11-04 DIAGNOSIS — Z6830 Body mass index (BMI) 30.0-30.9, adult: Secondary | ICD-10-CM

## 2019-11-04 DIAGNOSIS — E8881 Metabolic syndrome: Secondary | ICD-10-CM

## 2019-11-04 DIAGNOSIS — E538 Deficiency of other specified B group vitamins: Secondary | ICD-10-CM

## 2019-11-04 DIAGNOSIS — Z9189 Other specified personal risk factors, not elsewhere classified: Secondary | ICD-10-CM | POA: Diagnosis not present

## 2019-11-04 DIAGNOSIS — E038 Other specified hypothyroidism: Secondary | ICD-10-CM

## 2019-11-04 DIAGNOSIS — E559 Vitamin D deficiency, unspecified: Secondary | ICD-10-CM

## 2019-11-04 MED ORDER — VITAMIN D (ERGOCALCIFEROL) 1.25 MG (50000 UNIT) PO CAPS: 50000.0000 [IU] | ORAL_CAPSULE | ORAL | 0 refills | Status: DC

## 2019-11-04 NOTE — Patient Instructions (Signed)
The 10-year ASCVD risk score Denman George DC Montez Hageman., et al., 2013) is: 1.3%   Values used to calculate the score:     Age: 54 years     Sex: Female     Is Non-Hispanic African American: No     Diabetic: No     Tobacco smoker: No     Systolic Blood Pressure: 114 mmHg     Is BP treated: No     HDL Cholesterol: 76 mg/dL     Total Cholesterol: 237 mg/dL

## 2019-11-05 NOTE — Progress Notes (Signed)
Chief Complaint:   OBESITY Betty Price is here to discuss her progress with her obesity treatment plan along with follow-up of her obesity related diagnoses. Betty Price is on the Category 1 Plan and states she is following her eating plan approximately 75% of the time. Betty Price states she is playing tennis for 1.5-2 hours 3-4 times per week.  Today's visit was #: 2 Starting weight: 179 lbs Starting date: 10/21/2019 Today's weight: 178 lbs Today's date: 11/04/2019 Total lbs lost to date: 1 lb Total lbs lost since last in-office visit: 1 lb  Interim History: Betty Price says she was not hungry the first week.  Less ETOH in social situations.  She is only drinking 2 per day when she gets together with friends.  She is more mindful of why she is reaching for food/shacks during her work day.  She says she is playing tennis 2+ hours 3 days a week on average.  Hunger and cravings are well-controlled and she overall likes the plan.  She says it is not difficult to follow.  She drinks 60 ounces of water per day only.  She says her husband is a Associate Professor" making sure that she drinks enough.  Subjective:   1. Other hyperlipidemia Betty Price has hyperlipidemia and has been trying to improve her cholesterol levels with intensive lifestyle modification including a low saturated fat diet, exercise and weight loss. She denies any chest pain, claudication or myalgias.  Betty Price's 10-year ASCVD risk is 1.3%.  Positive family history.  Lab Results  Component Value Date   ALT 21 10/21/2019   AST 20 10/21/2019   ALKPHOS 92 10/21/2019   BILITOT 0.5 10/21/2019   Lab Results  Component Value Date   CHOL 237 (H) 10/21/2019   HDL 76 10/21/2019   LDLCALC 149 (H) 10/21/2019   TRIG 72 10/21/2019   CHOLHDL 3.3 11/06/2017   2. Vitamin D deficiency Betty Price's Vitamin D level was 47.1 on 10/21/2019. She is currently taking no vitamin D supplement. She denies nausea, vomiting or muscle weakness.  Betty Price placed her on vitamin D 12 weeks  ago.  Recent labs reflect her taking it once weekly for 3 months.  3. B12 deficiency She is not a vegetarian.  She does not have a previous diagnosis of pernicious anemia.  She does not have a history of weight loss surgery.  She takes an OTC supplement, but is unsure of the dose.  Lab Results  Component Value Date   VITAMINB12 656 10/21/2019   4. Insulin resistance Merrill has a diagnosis of insulin resistance based on her elevated fasting insulin level >5. She continues to work on diet and exercise to decrease her risk of diabetes.  This is a new diagnosis for [place].  Lab Results  Component Value Date   INSULIN 9.7 10/21/2019   Lab Results  Component Value Date   HGBA1C 5.6 10/21/2019   5. Other specified hypothyroidism Jya is taking NP Thyroid 30 mg daily.  She was started on this by an integrative medicine doctor at [facility] in the past.  She has been on it for 8+ weeks now.  Her energy level is good. She has no complaints.  Lab Results  Component Value Date   TSH 1.060 10/21/2019   6. At risk for diabetes mellitus Tene is at higher than average risk for developing diabetes due to her obesity and her new diagnosis of insulin resistance today.   Assessment/Plan:   1. Other hyperlipidemia Cardiovascular risk and specific  lipid/LDL goals reviewed.  We discussed several lifestyle modifications today and Sarie will continue to work on diet, exercise and weight loss efforts. Orders and follow up as documented in patient record.  Low saturated and trans fat diet.  Continue to exercise, weight loss.  Extensive education done, prudent nutritional plan.  Counseling Intensive lifestyle modifications are the first line treatment for this issue. . Dietary changes: Increase soluble fiber. Decrease simple carbohydrates. . Exercise changes: Moderate to vigorous-intensity aerobic activity 150 minutes per week if tolerated. . Lipid-lowering medications: see documented in medical record.  2.  Vitamin D deficiency Low Vitamin D level contributes to fatigue and are associated with obesity, breast, and colon cancer. She agrees to continue to take prescription Vitamin D @50 ,000 IU every week and will follow-up for routine testing of Vitamin D, at least 2-3 times per year to avoid over-replacement.  Labs are currently almost at goal of 50+.  rudent nutritional plan, weight loss. - Vitamin D, Ergocalciferol, (DRISDOL) 1.25 MG (50000 UNIT) CAPS capsule; Take 1 capsule (50,000 Units total) by mouth every 7 (seven) days.  Dispense: 4 capsule; Refill: 0  3. B12 deficiency The diagnosis was reviewed with the patient. Counseling provided today, see below. We will continue to monitor. Orders and follow up as documented in patient record.  Continue OTC supplement.  B12 level is at goal.  Continue prudent nutritional plan, weight loss.  Counseling . The body needs vitamin B12: to make red blood cells; to make DNA; and to help the nerves work properly so they can carry messages from the brain to the body.  . The main causes of vitamin B12 deficiency include dietary deficiency, digestive diseases, pernicious anemia, and having a surgery in which part of the stomach or small intestine is removed.  . Certain medicines can make it harder for the body to absorb vitamin B12. These medicines include: heartburn medications; some antibiotics; some medications used to treat diabetes, gout, and high cholesterol.  . In some cases, there are no symptoms of this condition. If the condition leads to anemia or nerve damage, various symptoms can occur, such as weakness or fatigue, shortness of breath, and numbness or tingling in your hands and feet.   . Treatment:  o May include taking vitamin B12 supplements.  o Avoid alcohol.  o Eat lots of healthy foods that contain vitamin B12: - Beef, pork, chicken, , and organ meats, such as liver.  - Seafood: This includes clams, rainbow trout, salmon, tuna, and haddock.  Eggs.  - Cereal and dairy products that are fortified: This means that vitamin B12 has been added to the food.   4. Insulin resistance Betty Price will continue to work on weight loss, exercise, and decreasing simple carbohydrates to help decrease the risk of diabetes. Betty Price agreed to follow-up with Betty Price as directed to closely monitor her progress.  5. Other specified hypothyroidism Continue thyroid medication at current dose.  Weight loss, prudent nutritional plan.  Orders and follow up as documented in patient record.  Counseling . Good thyroid control is important for overall health. Supratherapeutic thyroid levels are dangerous and will not improve weight loss results.   6. At risk for diabetes mellitus Betty Price was given approximately 15 minutes of diabetes education and counseling today. We discussed intensive lifestyle modifications today with an emphasis on weight loss as well as increasing exercise and decreasing simple carbohydrates in her diet. We also reviewed medication options with an emphasis on risk versus benefit of those discussed.  Repetitive spaced learning was employed today to elicit superior memory formation and behavioral change.  7. Class 1 obesity with serious comorbidity and body mass index (BMI) of 30.0 to 30.9 in adult, unspecified obesity type Betty Price is currently in the action stage of change. As such, her goal is to continue with weight loss efforts. She has agreed to the Category 1 Plan.   Exercise goals: As is.  Behavioral modification strategies: decreasing simple carbohydrates, increasing water intake and emotional eating strategies, decreasing saturated fats and high cholesterol foods.  Betty Price has agreed to follow-up with our clinic in 2 weeks. She was informed of the importance of frequent follow-up visits to maximize her success with intensive lifestyle modifications for her multiple health conditions.   Objective:   Blood pressure 114/74, pulse 65, temperature 98 F  (36.7 C), temperature source Oral, height 5\' 4"  (1.626 m), weight 178 lb (80.7 kg), SpO2 96 %. Body mass index is 30.55 kg/m.  General: Cooperative, alert, well developed, in no acute distress. HEENT: Conjunctivae and lids unremarkable. Cardiovascular: Regular rhythm.  Lungs: Normal work of breathing. Neurologic: No focal deficits.   Lab Results  Component Value Date   CREATININE 0.91 10/21/2019   BUN 18 10/21/2019   NA 140 10/21/2019   K 4.8 10/21/2019   CL 104 10/21/2019   CO2 22 10/21/2019   Lab Results  Component Value Date   ALT 21 10/21/2019   AST 20 10/21/2019   ALKPHOS 92 10/21/2019   BILITOT 0.5 10/21/2019   Lab Results  Component Value Date   HGBA1C 5.6 10/21/2019   HGBA1C 5.4 11/06/2017   HGBA1C 5.1 09/04/2016   HGBA1C 5.4 07/23/2015   HGBA1C 5.6 07/21/2014   Lab Results  Component Value Date   INSULIN 9.7 10/21/2019   Lab Results  Component Value Date   TSH 1.060 10/21/2019   Lab Results  Component Value Date   CHOL 237 (H) 10/21/2019   HDL 76 10/21/2019   LDLCALC 149 (H) 10/21/2019   TRIG 72 10/21/2019   CHOLHDL 3.3 11/06/2017   Lab Results  Component Value Date   WBC 3.7 10/21/2019   HGB 13.4 10/21/2019   HCT 39.0 10/21/2019   MCV 94 10/21/2019   PLT 245 10/21/2019   Attestation Statements:   Reviewed by clinician on day of visit: allergies, medications, problem list, medical history, surgical history, family history, social history, and previous encounter notes.  I, 10/23/2019, CMA, am acting as Insurance claims handler for Energy manager, DO.  I have reviewed the above documentation for accuracy and completeness, and I agree with the above. [employer], DO

## 2019-11-19 ENCOUNTER — Ambulatory Visit (INDEPENDENT_AMBULATORY_CARE_PROVIDER_SITE_OTHER): Payer: Managed Care, Other (non HMO) | Admitting: Family Medicine

## 2019-11-19 ENCOUNTER — Encounter (INDEPENDENT_AMBULATORY_CARE_PROVIDER_SITE_OTHER): Payer: Self-pay | Admitting: Family Medicine

## 2019-11-19 ENCOUNTER — Other Ambulatory Visit: Payer: Self-pay

## 2019-11-19 VITALS — BP 130/81 | HR 63 | Temp 98.1°F | Ht 64.0 in | Wt 174.0 lb

## 2019-11-19 DIAGNOSIS — E669 Obesity, unspecified: Secondary | ICD-10-CM

## 2019-11-19 DIAGNOSIS — E559 Vitamin D deficiency, unspecified: Secondary | ICD-10-CM

## 2019-11-19 DIAGNOSIS — Z9189 Other specified personal risk factors, not elsewhere classified: Secondary | ICD-10-CM

## 2019-11-19 DIAGNOSIS — E8881 Metabolic syndrome: Secondary | ICD-10-CM

## 2019-11-19 DIAGNOSIS — E538 Deficiency of other specified B group vitamins: Secondary | ICD-10-CM

## 2019-11-19 DIAGNOSIS — Z6830 Body mass index (BMI) 30.0-30.9, adult: Secondary | ICD-10-CM

## 2019-11-19 NOTE — Progress Notes (Signed)
Chief Complaint:   OBESITY Betty Price is here to discuss her progress with her obesity treatment plan along with follow-up of her obesity related diagnoses. Betty Price is on the Category 1 Plan and states she is following her eating plan approximately 75% of the time. Betty Price states she is playing tennis for 90 minutes 3-4 times per week.  Today's visit was #: 3 Starting weight: 179 lbs Starting date: 10/21/2019 Today's weight: 174 lbs Today's date: 11/19/2019 Total lbs lost to date: 5 lbs Total lbs lost since last in-office visit: 4 lbs  Interim History: Betty Price says "I am good" when it comes to breakfast and lunch.  She eats out on average 2 days a week or so.  She is not drinking wine anymore, only occasional [employer].  This is a change for her.  She only has 1-2 a week now.  She also has changed her socialization routine so that she is not tempted/focused on her health and wellness.  She says her hunger is controlled.  She is still having occasional cravings.  She has been more aware of water and trying to drink 6 bottles per day.  Subjective:   1. Insulin resistance Betty Price has a diagnosis of insulin resistance based on her elevated fasting insulin level >5. She continues to work on diet and exercise to decrease her risk of diabetes.  Lab Results  Component Value Date   INSULIN 9.7 10/21/2019   Lab Results  Component Value Date   HGBA1C 5.6 10/21/2019   2. Vitamin D deficiency Betty Price's Vitamin D level was 47.1 on 10/21/2019. She is currently taking prescription vitamin D 50,000 IU each week. She denies nausea, vomiting or muscle weakness.  3. B12 deficiency She is not a vegetarian.  She does not have a previous diagnosis of pernicious anemia.  She does not have a history of weight loss surgery.   Lab Results  Component Value Date   VITAMINB12 656 10/21/2019   4. At risk for dehydration Betty Price is at risk for dehydration due to playing tennis outdoors for several hours per  week.  Assessment/Plan:   1. Insulin resistance Betty Price will continue to work on weight loss, exercise, and decreasing simple carbohydrates to help decrease the risk of diabetes. Betty Price agreed to follow-up with [name] as directed to closely monitor her progress.  2. Vitamin D deficiency Low Vitamin D level contributes to fatigue and are associated with obesity, breast, and colon cancer. She agrees to continue to take prescription Vitamin D @50 ,000 IU every week and will follow-up for routine testing of Vitamin D, at least 2-3 times per year to avoid over-replacement.  3. B12 deficiency The diagnosis was reviewed with the patient. Counseling provided today, see below. We will continue to monitor. Orders and follow up as documented in patient record.  Counseling . The body needs vitamin B12: to make red blood cells; to make DNA; and to help the nerves work properly so they can carry messages from the brain to the body.  . The main causes of vitamin B12 deficiency include dietary deficiency, digestive diseases, pernicious anemia, and having a surgery in which part of the stomach or small intestine is removed.  . Certain medicines can make it harder for the body to absorb vitamin B12. These medicines include: heartburn medications; some antibiotics; some medications used to treat diabetes, gout, and high cholesterol.  . In some cases, there are no symptoms of this condition. If the condition leads to anemia or nerve damage,  various symptoms can occur, such as weakness or fatigue, shortness of breath, and numbness or tingling in your hands and feet.   . Treatment:  o May include taking vitamin B12 supplements.  o Avoid alcohol.  o Eat lots of healthy foods that contain vitamin B12: - Beef, pork, chicken, [place], and organ meats, such as liver.  - Seafood: This includes clams, rainbow trout, salmon, tuna, and haddock. Eggs.  - Cereal and dairy products that are fortified: This means that vitamin B12 has  been added to the food.   4. At risk for dehydration Betty Price was given approximately 15 minutes dehydration prevention counseling today. Betty Price is at risk for dehydration due to weight loss and current medication(s). She was encouraged to hydrate and monitor fluid status to avoid dehydration as well as weight loss plateaus.   5. Class 1 obesity with serious comorbidity and body mass index (BMI) of 30.0 to 30.9 in adult, unspecified obesity type Betty Price is currently in the action stage of change. As such, her goal is to continue with weight loss efforts. She has agreed to the Category 1 Plan.   Exercise goals: As is.  Behavioral modification strategies: increasing lean protein intake, increasing water intake and meal planning and cooking strategies.  Betty Price has agreed to follow-up with our clinic in 2 weeks. She was informed of the importance of frequent follow-up visits to maximize her success with intensive lifestyle modifications for her multiple health conditions.   Objective:   Blood pressure 130/81, pulse 63, temperature 98.1 F (36.7 C), height 5\' 4"  (1.626 m), weight 174 lb (78.9 kg), SpO2 98 %. Body mass index is 29.87 kg/m.  General: Cooperative, alert, well developed, in no acute distress. HEENT: Conjunctivae and lids unremarkable. Cardiovascular: Regular rhythm.  Lungs: Normal work of breathing. Neurologic: No focal deficits.   Lab Results  Component Value Date   CREATININE 0.91 10/21/2019   BUN 18 10/21/2019   NA 140 10/21/2019   K 4.8 10/21/2019   CL 104 10/21/2019   CO2 22 10/21/2019   Lab Results  Component Value Date   ALT 21 10/21/2019   AST 20 10/21/2019   ALKPHOS 92 10/21/2019   BILITOT 0.5 10/21/2019   Lab Results  Component Value Date   HGBA1C 5.6 10/21/2019   HGBA1C 5.4 11/06/2017   HGBA1C 5.1 09/04/2016   HGBA1C 5.4 07/23/2015   HGBA1C 5.6 07/21/2014   Lab Results  Component Value Date   INSULIN 9.7 10/21/2019   Lab Results  Component Value Date    TSH 1.060 10/21/2019   Lab Results  Component Value Date   CHOL 237 (H) 10/21/2019   HDL 76 10/21/2019   LDLCALC 149 (H) 10/21/2019   TRIG 72 10/21/2019   CHOLHDL 3.3 11/06/2017   Lab Results  Component Value Date   WBC 3.7 10/21/2019   HGB 13.4 10/21/2019   HCT 39.0 10/21/2019   MCV 94 10/21/2019   PLT 245 10/21/2019   Attestation Statements:   Reviewed by clinician on day of visit: allergies, medications, problem list, medical history, surgical history, family history, social history, and previous encounter notes.  I, 10/23/2019, CMA, am acting as Insurance claims handler for Energy manager, DO.  I have reviewed the above documentation for accuracy and completeness, and I agree with the above. [employer], DO

## 2019-12-10 ENCOUNTER — Ambulatory Visit (INDEPENDENT_AMBULATORY_CARE_PROVIDER_SITE_OTHER): Payer: Managed Care, Other (non HMO) | Admitting: Family Medicine

## 2019-12-10 ENCOUNTER — Encounter (INDEPENDENT_AMBULATORY_CARE_PROVIDER_SITE_OTHER): Payer: Self-pay | Admitting: Family Medicine

## 2019-12-10 ENCOUNTER — Ambulatory Visit: Payer: Managed Care, Other (non HMO) | Admitting: Family Medicine

## 2019-12-10 ENCOUNTER — Other Ambulatory Visit: Payer: Self-pay

## 2019-12-10 VITALS — BP 132/72 | HR 67 | Temp 98.1°F | Ht 64.0 in | Wt 174.0 lb

## 2019-12-10 DIAGNOSIS — Z9189 Other specified personal risk factors, not elsewhere classified: Secondary | ICD-10-CM | POA: Diagnosis not present

## 2019-12-10 DIAGNOSIS — Z6830 Body mass index (BMI) 30.0-30.9, adult: Secondary | ICD-10-CM

## 2019-12-10 DIAGNOSIS — E559 Vitamin D deficiency, unspecified: Secondary | ICD-10-CM

## 2019-12-10 DIAGNOSIS — E8881 Metabolic syndrome: Secondary | ICD-10-CM

## 2019-12-10 DIAGNOSIS — E669 Obesity, unspecified: Secondary | ICD-10-CM

## 2019-12-10 MED ORDER — VITAMIN D (ERGOCALCIFEROL) 1.25 MG (50000 UNIT) PO CAPS: 50000.0000 [IU] | ORAL_CAPSULE | ORAL | 0 refills | Status: DC

## 2019-12-10 NOTE — Progress Notes (Signed)
Chief Complaint:   OBESITY Betty Price is here to discuss her progress with her obesity treatment plan along with follow-up of her obesity related diagnoses. Betty Price is on the Category 1 Plan and states she is following her eating plan approximately 70% of the time. Betty Price states she is playing tennis for 90 minutes 1-2 times per week.  Today's visit was #: 4 Starting weight: 179 lbs Starting date: 6/22/021 Today's weight: 174 lbs Today's date: 12/15/2019 Total lbs lost to date: 5 lbs Total lbs lost since last in-office visit: 0  Interim History: Betty Price says she did a lot of celebration eating ("vacation eating") as well as 2 of her girlfriends having birthdays, etc.  She also worked 4-5 days of long hours and into the early morning, thus she has had much more stress in her life lately.  She has also done less exercise over the last 2 weeks.    Subjective:   1. Insulin resistance Betty Price has a diagnosis of insulin resistance based on her elevated fasting insulin level >5. She continues to work on diet and exercise to decrease her risk of diabetes.  Lab Results  Component Value Date   INSULIN 9.7 10/21/2019   Lab Results  Component Value Date   HGBA1C 5.6 10/21/2019   2. Vitamin D deficiency [name] Vitamin D level was 47.1 on 10/21/2019. She is currently taking prescription vitamin D 50,000 IU each week. She denies nausea, vomiting or muscle weakness.  No side effects.  She says she has less aches and pains in her joints since starting the prescription.  3. At risk for dehydration Betty Price is at risk for dehydration due to playing tennis and working out, especially with it being summer.  Assessment/Plan:   1. Insulin resistance Betty Price will continue to work on weight loss, exercise, and decreasing simple carbohydrates to help decrease the risk of diabetes. Betty Price agreed to follow-up with Betty Price as directed to closely monitor her progress.  2. Vitamin D deficiency Low Vitamin D level contributes to  fatigue and are associated with obesity, breast, and colon cancer. She agrees to continue to take prescription Vitamin D @50 ,000 IU every week and will follow-up for routine testing of Vitamin D, at least 2-3 times per year to avoid over-replacement.  Recheck vitamin D level in 3 months.  - Vitamin D, Ergocalciferol, (DRISDOL) 1.25 MG (50000 UNIT) CAPS capsule; Take 1 capsule (50,000 Units total) by mouth every 7 (seven) days.  Dispense: 4 capsule; Refill: 0  3. At risk for dehydration Betty Price was given approximately 15 minutes dehydration prevention counseling today. Betty Price is at risk for dehydration due to weight loss and current medication(s). She was encouraged to hydrate and monitor fluid status to avoid dehydration as well as weight loss plateaus.   4. Class 1 obesity with serious comorbidity and body mass index (BMI) of 30.0 to 30.9 in adult, unspecified obesity type Betty Price is currently in the action stage of change. As such, her goal is to continue with weight loss efforts. She has agreed to the Category 1 Plan.   Eat out 2 nights per week and make [place]/PC.  Exercise goals: As is.  Goal for the next office visit is to get back to the gym for 2 days a week and use the elliptical for 30 minutes and do 30 minutes of weight lifting.  Behavioral modification strategies: increasing lean protein intake, decreasing simple carbohydrates, increasing water intake, decreasing eating out, meal planning and cooking strategies and planning for success.  Betty Price has agreed to follow-up with our clinic in 2 weeks. She was informed of the importance of frequent follow-up visits to maximize her success with intensive lifestyle modifications for her multiple health conditions.   Objective:   Blood pressure 132/72, pulse 67, temperature 98.1 F (36.7 C), height 5\' 4"  (1.626 m), weight 174 lb (78.9 kg), SpO2 95 %. Body mass index is 29.87 kg/m.  General: Cooperative, alert, well developed, in no acute  distress. HEENT: Conjunctivae and lids unremarkable. Cardiovascular: Regular rhythm.  Lungs: Normal work of breathing. Neurologic: No focal deficits.   Lab Results  Component Value Date   CREATININE 0.91 10/21/2019   BUN 18 10/21/2019   NA 140 10/21/2019   K 4.8 10/21/2019   CL 104 10/21/2019   CO2 22 10/21/2019   Lab Results  Component Value Date   ALT 21 10/21/2019   AST 20 10/21/2019   ALKPHOS 92 10/21/2019   BILITOT 0.5 10/21/2019   Lab Results  Component Value Date   HGBA1C 5.6 10/21/2019   HGBA1C 5.4 11/06/2017   HGBA1C 5.1 09/04/2016   HGBA1C 5.4 07/23/2015   HGBA1C 5.6 07/21/2014   Lab Results  Component Value Date   INSULIN 9.7 10/21/2019   Lab Results  Component Value Date   TSH 1.060 10/21/2019   Lab Results  Component Value Date   CHOL 237 (H) 10/21/2019   HDL 76 10/21/2019   LDLCALC 149 (H) 10/21/2019   TRIG 72 10/21/2019   CHOLHDL 3.3 11/06/2017   Lab Results  Component Value Date   WBC 3.7 10/21/2019   HGB 13.4 10/21/2019   HCT 39.0 10/21/2019   MCV 94 10/21/2019   PLT 245 10/21/2019   Attestation Statements:   Reviewed by clinician on day of visit: allergies, medications, problem list, medical history, surgical history, family history, social history, and previous encounter notes.  I, 10/23/2019, CMA, am acting as Insurance claims handler for Energy manager, DO.  I have reviewed the above documentation for accuracy and completeness, and I agree with the above. -  [employer], DO

## 2019-12-24 ENCOUNTER — Ambulatory Visit (INDEPENDENT_AMBULATORY_CARE_PROVIDER_SITE_OTHER): Payer: Managed Care, Other (non HMO) | Admitting: Family Medicine

## 2019-12-24 ENCOUNTER — Encounter (INDEPENDENT_AMBULATORY_CARE_PROVIDER_SITE_OTHER): Payer: Self-pay | Admitting: Family Medicine

## 2019-12-24 ENCOUNTER — Other Ambulatory Visit: Payer: Self-pay

## 2019-12-24 VITALS — BP 124/80 | HR 70 | Temp 97.8°F | Ht 64.0 in | Wt 169.0 lb

## 2019-12-24 DIAGNOSIS — Z6830 Body mass index (BMI) 30.0-30.9, adult: Secondary | ICD-10-CM

## 2019-12-24 DIAGNOSIS — E8881 Metabolic syndrome: Secondary | ICD-10-CM | POA: Diagnosis not present

## 2019-12-24 DIAGNOSIS — E559 Vitamin D deficiency, unspecified: Secondary | ICD-10-CM | POA: Diagnosis not present

## 2019-12-24 DIAGNOSIS — Z9189 Other specified personal risk factors, not elsewhere classified: Secondary | ICD-10-CM

## 2019-12-24 DIAGNOSIS — E7849 Other hyperlipidemia: Secondary | ICD-10-CM | POA: Diagnosis not present

## 2019-12-24 DIAGNOSIS — E669 Obesity, unspecified: Secondary | ICD-10-CM

## 2019-12-24 MED ORDER — VITAMIN D (ERGOCALCIFEROL) 1.25 MG (50000 UNIT) PO CAPS: 50000.0000 [IU] | ORAL_CAPSULE | ORAL | 0 refills | Status: DC

## 2019-12-24 NOTE — Progress Notes (Signed)
Chief Complaint:   OBESITY Betty Price is here to discuss her progress with her obesity treatment plan along with follow-up of her obesity related diagnoses. Betty Price is on the Category 1 Plan and states she is following her eating plan approximately 80% of the time. Betty Price states she is playing tennis for 90 minutes 1 times per week.  Today's visit was #: 5 Starting weight: 179 lbs Starting date: 10/21/2019 Today's weight: 169 lbs Today's date: 12/31/2019 Total lbs lost to date: 10 lbs Total lbs lost since last in-office visit: 5 lbs  Interim History: Betty Price says her ankle is somewhat better and not worse.  She is getting better water intake - 5-6 bottles per day.  She feels great, she says.  She is fitting into clothes now that she has not worn for a long time.  Denies issues with the meal plan.  Subjective:   1. Insulin resistance Betty Price has a diagnosis of insulin resistance based on her elevated fasting insulin level >5. She continues to work on diet and exercise to decrease her risk of diabetes.  Her fasting insulin level is elevated.  Lab Results  Component Value Date   INSULIN 9.7 10/21/2019   Lab Results  Component Value Date   HGBA1C 5.6 10/21/2019   2. Other hyperlipidemia Betty Price has hyperlipidemia and has been trying to improve her cholesterol levels with intensive lifestyle modification including a low saturated fat diet, exercise and weight loss. She denies any chest pain, claudication or myalgias.  She has an elevated LDL with elevated HDL.  Lab Results  Component Value Date   ALT 21 10/21/2019   AST 20 10/21/2019   ALKPHOS 92 10/21/2019   BILITOT 0.5 10/21/2019   Lab Results  Component Value Date   CHOL 237 (H) 10/21/2019   HDL 76 10/21/2019   LDLCALC 149 (H) 10/21/2019   TRIG 72 10/21/2019   CHOLHDL 3.3 11/06/2017   3. Vitamin D deficiency Betty Price's Vitamin D level was 47.1 on 10/21/2019. She is currently taking prescription vitamin D 50,000 IU each week. She denies  nausea, vomiting or muscle weakness.  4. At risk for hyperglycemia Betty Price is at risk of hyperglycemia due to insulin resistance.   Assessment/Plan:   1. Insulin resistance Discussed labs with patient today.  Betty Price will continue to work on weight loss, exercise, and decreasing simple carbohydrates to help decrease the risk of diabetes. Betty Price agreed to follow-up with [name] as directed to closely monitor her progress.  2. Other hyperlipidemia Discussed labs with patient today.  Cardiovascular risk and specific lipid/LDL goals reviewed.  We discussed several lifestyle modifications today and Betty Price will continue to work on diet, exercise and weight loss efforts. Orders and follow up as documented in patient record.  Recheck labs around late September, 3 months after last check.  Counseling Intensive lifestyle modifications are the first line treatment for this issue.  Dietary changes: Increase soluble fiber. Decrease simple carbohydrates.  Exercise changes: Moderate to vigorous-intensity aerobic activity 150 minutes per week if tolerated.  Lipid-lowering medications: see documented in medical record.  3. Vitamin D deficiency Low Vitamin D level contributes to fatigue and are associated with obesity, breast, and colon cancer. She agrees to continue to take prescription Vitamin D @50 ,000 IU every week and will follow-up for routine testing of Vitamin D, at least 2-3 times per year to avoid over-replacement.  Recheck 3 months after starting medication.  Continue prudent nutritional plan, weight loss.  -Refill Vitamin D, Ergocalciferol, (DRISDOL) 1.25 MG (  50000 UNIT) CAPS capsule; Take 1 capsule (50,000 Units total) by mouth every 7 (seven) days.  Dispense: 4 capsule; Refill: 0  4. At risk for hyperglycemia Betty Price was given approximately 7 minutes of counseling today regarding prevention of hyperglycemia. She was advised of hyperglycemia causes and the fact hyperglycemia is often asymptomatic. Betty Price was  instructed to avoid skipping meals, eat regular protein rich meals and schedule low calorie but protein rich snacks as needed.   Repetitive spaced learning was employed today to elicit superior memory formation and behavioral change  5. Class 1 obesity with serious comorbidity and body mass index (BMI) of 30.0 to 30.9 in adult, unspecified obesity type Betty Price is currently in the action stage of change. As such, her goal is to continue with weight loss efforts. She has agreed to the Category 1 Plan.   Exercise goals: As is.  Behavioral modification strategies: increasing lean protein intake, increasing water intake, no skipping meals, meal planning and cooking strategies and planning for success.  Betty Price has agreed to follow-up with our clinic in 2 weeks. She was informed of the importance of frequent follow-up visits to maximize her success with intensive lifestyle modifications for her multiple health conditions.   Objective:   Blood pressure 124/80, pulse 70, temperature 97.8 F (36.6 C), height 5\' 4"  (1.626 m), weight 169 lb (76.7 kg), SpO2 97 %. Body mass index is 29.01 kg/m.  General: Cooperative, alert, well developed, in no acute distress. HEENT: Conjunctivae and lids unremarkable. Cardiovascular: Regular rhythm.  Lungs: Normal work of breathing. Neurologic: No focal deficits.   Lab Results  Component Value Date   CREATININE 0.91 10/21/2019   BUN 18 10/21/2019   NA 140 10/21/2019   K 4.8 10/21/2019   CL 104 10/21/2019   CO2 22 10/21/2019   Lab Results  Component Value Date   ALT 21 10/21/2019   AST 20 10/21/2019   ALKPHOS 92 10/21/2019   BILITOT 0.5 10/21/2019   Lab Results  Component Value Date   HGBA1C 5.6 10/21/2019   HGBA1C 5.4 11/06/2017   HGBA1C 5.1 09/04/2016   HGBA1C 5.4 07/23/2015   HGBA1C 5.6 07/21/2014   Lab Results  Component Value Date   INSULIN 9.7 10/21/2019   Lab Results  Component Value Date   TSH 1.060 10/21/2019   Lab Results  Component  Value Date   CHOL 237 (H) 10/21/2019   HDL 76 10/21/2019   LDLCALC 149 (H) 10/21/2019   TRIG 72 10/21/2019   CHOLHDL 3.3 11/06/2017   Lab Results  Component Value Date   WBC 3.7 10/21/2019   HGB 13.4 10/21/2019   HCT 39.0 10/21/2019   MCV 94 10/21/2019   PLT 245 10/21/2019   Attestation Statements:   Reviewed by clinician on day of visit: allergies, medications, problem list, medical history, surgical history, family history, social history, and previous encounter notes.  I, 10/23/2019, CMA, am acting as Insurance claims handler for Energy manager, DO.  I have reviewed the above documentation for accuracy and completeness, and I agree with the above. [employer], DO

## 2019-12-26 ENCOUNTER — Ambulatory Visit: Payer: Managed Care, Other (non HMO) | Admitting: Physical Therapy

## 2020-01-02 ENCOUNTER — Ambulatory Visit: Payer: Managed Care, Other (non HMO) | Admitting: Physical Therapy

## 2020-01-07 ENCOUNTER — Other Ambulatory Visit: Payer: Self-pay

## 2020-01-07 ENCOUNTER — Encounter (INDEPENDENT_AMBULATORY_CARE_PROVIDER_SITE_OTHER): Payer: Self-pay | Admitting: Family Medicine

## 2020-01-07 ENCOUNTER — Ambulatory Visit (INDEPENDENT_AMBULATORY_CARE_PROVIDER_SITE_OTHER): Payer: Managed Care, Other (non HMO) | Admitting: Family Medicine

## 2020-01-07 VITALS — BP 121/74 | HR 59 | Temp 98.0°F | Ht 64.0 in | Wt 166.0 lb

## 2020-01-07 DIAGNOSIS — E559 Vitamin D deficiency, unspecified: Secondary | ICD-10-CM

## 2020-01-07 DIAGNOSIS — E669 Obesity, unspecified: Secondary | ICD-10-CM

## 2020-01-07 DIAGNOSIS — E8881 Metabolic syndrome: Secondary | ICD-10-CM | POA: Diagnosis not present

## 2020-01-07 DIAGNOSIS — Z6830 Body mass index (BMI) 30.0-30.9, adult: Secondary | ICD-10-CM

## 2020-01-07 MED ORDER — VITAMIN D (ERGOCALCIFEROL) 1.25 MG (50000 UNIT) PO CAPS: 50000.0000 [IU] | ORAL_CAPSULE | ORAL | 0 refills | Status: DC

## 2020-01-08 NOTE — Progress Notes (Signed)
Chief Complaint:   OBESITY Betty Price is here to discuss her progress with her obesity treatment plan along with follow-up of her obesity related diagnoses. Betty Price is on the Category 1 Plan and states she is following her eating plan approximately 70% of the time. Betty Price states she is playing tennis for 90 minutes 3+ times per week.  Today's visit was #: 6 Starting weight: 179 lbs Starting date: 10/21/2019 Today's weight: 166 lbs Today's date: 01/07/2020 Total lbs lost to date: 13 lbs Total lbs lost since last in-office visit: 3 lbs  Interim History: Betty Price feels great and is wearing clothes she has not worn in a long while.  Her habits are changing even more.  She is making great choices and exhibiting great restraint, even in social situations.  She is exercising more and drinking less wine/ETOH.  Subjective:   1. Insulin resistance Betty Price has a diagnosis of insulin resistance based on her elevated fasting insulin level >5. She continues to work on diet and exercise to decrease her risk of diabetes.  Lab Results  Component Value Date   INSULIN 9.7 10/21/2019   Lab Results  Component Value Date   HGBA1C 5.6 10/21/2019   2. Vitamin D deficiency Betty Price's Vitamin D level was 47.1 on 10/21/2019. She is currently taking prescription vitamin D 50,000 IU each week. She denies nausea, vomiting or muscle weakness.  Assessment/Plan:   1. Insulin resistance Betty Price will continue to work on weight loss, exercise, and decreasing simple carbohydrates to help decrease the risk of diabetes. Betty Price agreed to follow-up with [name] as directed to closely monitor her progress.  2. Vitamin D deficiency Low Vitamin D level contributes to fatigue and are associated with obesity, breast, and colon cancer. She agrees to continue to take prescription Vitamin D @50 ,000 IU every week and will follow-up for routine testing of Vitamin D, at least 2-3 times per year to avoid over-replacement.  -Refill Vitamin D, Ergocalciferol,  (DRISDOL) 1.25 MG (50000 UNIT) CAPS capsule; Take 1 capsule (50,000 Units total) by mouth every 7 (seven) days.  Dispense: 4 capsule; Refill: 0  3. Class 1 obesity with serious comorbidity and body mass index (BMI) of 30.0 to 30.9 in adult, unspecified obesity type Betty Price is currently in the action stage of change. As such, her goal is to continue with weight loss efforts. She has agreed to the Category 1 Plan.   Exercise goals: As is.  Behavioral modification strategies: increasing lean protein intake, meal planning and cooking strategies, celebration eating strategies and planning for success.  Betty Price has agreed to follow-up with our clinic in 2 weeks. She was informed of the importance of frequent follow-up visits to maximize her success with intensive lifestyle modifications for her multiple health conditions.   Objective:   Blood pressure 121/74, pulse (!) 59, temperature 98 F (36.7 C), height 5\' 4"  (1.626 m), weight 166 lb (75.3 kg), SpO2 100 %. Body mass index is 28.49 kg/m.  General: Cooperative, alert, well developed, in no acute distress. HEENT: Conjunctivae and lids unremarkable. Cardiovascular: Regular rhythm.  Lungs: Normal work of breathing. Neurologic: No focal deficits.   Lab Results  Component Value Date   CREATININE 0.91 10/21/2019   BUN 18 10/21/2019   NA 140 10/21/2019   K 4.8 10/21/2019   CL 104 10/21/2019   CO2 22 10/21/2019   Lab Results  Component Value Date   ALT 21 10/21/2019   AST 20 10/21/2019   ALKPHOS 92 10/21/2019   BILITOT 0.5  10/21/2019   Lab Results  Component Value Date   HGBA1C 5.6 10/21/2019   HGBA1C 5.4 11/06/2017   HGBA1C 5.1 09/04/2016   HGBA1C 5.4 07/23/2015   HGBA1C 5.6 07/21/2014   Lab Results  Component Value Date   INSULIN 9.7 10/21/2019   Lab Results  Component Value Date   TSH 1.060 10/21/2019   Lab Results  Component Value Date   CHOL 237 (H) 10/21/2019   HDL 76 10/21/2019   LDLCALC 149 (H) 10/21/2019   TRIG 72  10/21/2019   CHOLHDL 3.3 11/06/2017   Lab Results  Component Value Date   WBC 3.7 10/21/2019   HGB 13.4 10/21/2019   HCT 39.0 10/21/2019   MCV 94 10/21/2019   PLT 245 10/21/2019   Attestation Statements:   Reviewed by clinician on day of visit: allergies, medications, problem list, medical history, surgical history, family history, social history, and previous encounter notes.  I, Insurance claims handler, CMA, am acting as Energy manager for [employer], DO.  I have reviewed the above documentation for accuracy and completeness, and I agree with the above. Thomasene Lot, DO

## 2020-01-09 ENCOUNTER — Encounter: Payer: Managed Care, Other (non HMO) | Admitting: Physical Therapy

## 2020-01-16 ENCOUNTER — Encounter: Payer: Managed Care, Other (non HMO) | Admitting: Physical Therapy

## 2020-01-21 ENCOUNTER — Ambulatory Visit (INDEPENDENT_AMBULATORY_CARE_PROVIDER_SITE_OTHER): Payer: Managed Care, Other (non HMO) | Admitting: Family Medicine

## 2020-01-29 ENCOUNTER — Other Ambulatory Visit: Payer: Self-pay

## 2020-01-29 ENCOUNTER — Ambulatory Visit (INDEPENDENT_AMBULATORY_CARE_PROVIDER_SITE_OTHER): Payer: Managed Care, Other (non HMO) | Admitting: Family Medicine

## 2020-01-29 ENCOUNTER — Encounter (INDEPENDENT_AMBULATORY_CARE_PROVIDER_SITE_OTHER): Payer: Self-pay | Admitting: Family Medicine

## 2020-01-29 VITALS — BP 110/69 | HR 61 | Temp 98.4°F | Ht 64.0 in | Wt 163.6 lb

## 2020-01-29 DIAGNOSIS — Z6830 Body mass index (BMI) 30.0-30.9, adult: Secondary | ICD-10-CM

## 2020-01-29 DIAGNOSIS — E559 Vitamin D deficiency, unspecified: Secondary | ICD-10-CM

## 2020-01-29 DIAGNOSIS — E669 Obesity, unspecified: Secondary | ICD-10-CM

## 2020-01-29 MED ORDER — VITAMIN D (ERGOCALCIFEROL) 1.25 MG (50000 UNIT) PO CAPS: 50000.0000 [IU] | ORAL_CAPSULE | ORAL | 0 refills | Status: DC

## 2020-01-29 NOTE — Progress Notes (Signed)
Chief Complaint:   OBESITY Betty Price is here to discuss her progress with her obesity treatment plan along with follow-up of her obesity related diagnoses. Betty Price is on the Category 1 Plan and states she is following her eating plan approximately 60% of the time. Betty Price states she is playing tennis for 60 minutes 1 times per week.  Today's visit was #: 7 Starting weight: 179 lbs Starting date: 10/21/2019 Today's weight: 163 lbs Today's date: 02/02/2020 Total lbs lost to date: 16 lbs Total lbs lost since last in-office visit: 3 lbs   Interim History: Betty Price has been entertaining more and eating out much more lately via work and personal.    Subjective:   1. Vitamin D deficiency Betty Price's Vitamin D level was 47.1 on 10/21/2019. She is currently taking prescription vitamin D 50,000 IU each week. She denies nausea, vomiting or muscle weakness.     Assessment/Plan:   1. Vitamin D deficiency Low Vitamin D level contributes to fatigue and are associated with obesity, breast, and colon cancer. She agrees to continue to take prescription Vitamin D @50 ,000 IU every week and will follow-up for routine testing of Vitamin D, at least 2-3 times per year to avoid over-replacement.  -Refill Vitamin D, Ergocalciferol, (DRISDOL) 1.25 MG (50000 UNIT) CAPS capsule; Take 1 capsule (50,000 Units total) by mouth every 7 (seven) days.  Dispense: 4 capsule; Refill: 0  2. Class 1 obesity with serious comorbidity and body mass index (BMI) of 30.0 to 30.9 in adult, unspecified obesity type  Betty Price is currently in the action stage of change. As such, her goal is to continue with weight loss efforts. She has agreed to the Category 1 Plan.   Exercise goals: As is.  Behavioral modification strategies: increasing lean protein intake, decreasing simple carbohydrates, increasing vegetables, meal planning and cooking strategies, better snacking choices, holiday eating strategies  and celebration eating strategies.  Betty Price  has agreed to follow-up with our clinic in 2-3 weeks. She was informed of the importance of frequent follow-up visits to maximize her success with intensive lifestyle modifications for her multiple health conditions.     Objective:   Blood pressure 110/69, pulse 61, temperature 98.4 F (36.9 C), height 5\' 4"  (1.626 m), weight 163 lb 9.6 oz (74.2 kg), SpO2 99 %. Body mass index is 28.08 kg/m.  General: Cooperative, alert, well developed, in no acute distress. HEENT: Conjunctivae and lids unremarkable. Cardiovascular: Regular rhythm.  Lungs: Normal work of breathing. Neurologic: No focal deficits.   Lab Results  Component Value Date   CREATININE 0.91 10/21/2019   BUN 18 10/21/2019   NA 140 10/21/2019   K 4.8 10/21/2019   CL 104 10/21/2019   CO2 22 10/21/2019   Lab Results  Component Value Date   ALT 21 10/21/2019   AST 20 10/21/2019   ALKPHOS 92 10/21/2019   BILITOT 0.5 10/21/2019   Lab Results  Component Value Date   HGBA1C 5.6 10/21/2019   HGBA1C 5.4 11/06/2017   HGBA1C 5.1 09/04/2016   HGBA1C 5.4 07/23/2015   HGBA1C 5.6 07/21/2014   Lab Results  Component Value Date   INSULIN 9.7 10/21/2019   Lab Results  Component Value Date   TSH 1.060 10/21/2019   Lab Results  Component Value Date   CHOL 237 (H) 10/21/2019   HDL 76 10/21/2019   LDLCALC 149 (H) 10/21/2019   TRIG 72 10/21/2019   CHOLHDL 3.3 11/06/2017   Lab Results  Component Value Date   WBC 3.7  10/21/2019   HGB 13.4 10/21/2019   HCT 39.0 10/21/2019   MCV 94 10/21/2019   PLT 245 10/21/2019     Attestation Statements:   Reviewed by clinician on day of visit: allergies, medications, problem list, medical history, surgical history, family history, social history, and previous encounter notes.  I, Insurance claims handler, CMA, am acting as Energy manager for [employer], DO.  I have reviewed the above documentation for accuracy and completeness, and I agree with the above. Thomasene Lot, DO

## 2020-02-18 ENCOUNTER — Ambulatory Visit (INDEPENDENT_AMBULATORY_CARE_PROVIDER_SITE_OTHER): Payer: Managed Care, Other (non HMO) | Admitting: Family Medicine

## 2020-02-25 ENCOUNTER — Encounter (INDEPENDENT_AMBULATORY_CARE_PROVIDER_SITE_OTHER): Payer: Self-pay | Admitting: Family Medicine

## 2020-02-25 ENCOUNTER — Other Ambulatory Visit: Payer: Self-pay

## 2020-02-25 ENCOUNTER — Ambulatory Visit (INDEPENDENT_AMBULATORY_CARE_PROVIDER_SITE_OTHER): Payer: Managed Care, Other (non HMO) | Admitting: Family Medicine

## 2020-02-25 VITALS — BP 124/74 | HR 62 | Temp 97.8°F | Ht 64.0 in | Wt 163.0 lb

## 2020-02-25 DIAGNOSIS — Z6830 Body mass index (BMI) 30.0-30.9, adult: Secondary | ICD-10-CM

## 2020-02-25 DIAGNOSIS — E669 Obesity, unspecified: Secondary | ICD-10-CM

## 2020-02-25 DIAGNOSIS — E559 Vitamin D deficiency, unspecified: Secondary | ICD-10-CM | POA: Diagnosis not present

## 2020-02-26 NOTE — Progress Notes (Signed)
Chief Complaint:   OBESITY Betty Price is here to discuss her progress with her obesity treatment plan along with follow-up of her obesity related diagnoses. Betty Price is on the Category 1 Plan and states she is following her eating plan approximately 50-60% of the time. [name] states she is playing tennis for 90 minutes 2 times per week.  Today's visit was #: 8 Starting weight: 179 lbs Starting date: 10/21/2019 Today's weight: 163 lbs Today's date: 02/25/2020 Total lbs lost to date: 16 lbs Total lbs lost since last in-office visit: 0 Total weight loss percentage to date: -8.94%  Interim History: Betty Price says her daughter turned 48, her sister was here for 3 weeks, and they went on vacation.  She has been playing much less tennis.  She says that Pasta Zero is a treat.  She has been making good choices and feels in control.  Assessment/Plan:   1. Vitamin D deficiency [name] has a history of Vitamin D deficiency with resultant generalized fatigue as her primary symptom.  she is taking vitamin D 50,000 IU weekly for this deficiency and tolerating it well without side-effect.  Most recent Vitamin D lab reviewed-  level: 47.1.  Plan:   - Discussed importance of vitamin D (as well as calcium) to their health and wellbeing.   - We reviewed possible symptoms of low Vitamin D including low energy, depressed mood, muscle aches, joint aches, osteoporosis, etc.  - We discussed that low Vitamin D levels may be linked to an increased risk of cardiovascular events, and even increased risk of cancers, such as colon and breast.   - Educated pt that weight loss will likely improve availability of vitamin D, thus encouraged Betty Price to continue with meal plan and their weight loss efforts to further improve this condition.  - I recommend pt take weekly prescription vit D 50,000 IU, which pt agrees to after discussion of the risks and benefits of this medication.      - Informed patient this may be a lifelong  thing, and she was encouraged to continue to take the medicine until told otherwise.  We will need to monitor levels regularly (every 3-4 mo on average) to keep levels within normal limits.   - All pt's questions and concerns regarding this condition addressed.  2. Class 1 obesity with serious comorbidity and body mass index (BMI) of 30.0 to 30.9 in adult, unspecified obesity type  Betty Price is currently in the action stage of change. As such, her goal is to continue with weight loss efforts. She has agreed to the Category 1 Plan.   Exercise goals: Goal is weight lifting for 2 days per week in addition to cardio.  Behavioral modification strategies: planning for success.  Betty Price has agreed to follow-up with our clinic in 3 weeks. She was informed of the importance of frequent follow-up visits to maximize her success with intensive lifestyle modifications for her multiple health conditions.   Objective:   Blood pressure 124/74, pulse 62, temperature 97.8 F (36.6 C), height 5\' 4"  (1.626 m), weight 163 lb (73.9 kg), SpO2 98 %. Body mass index is 27.98 kg/m.  General: Cooperative, alert, well developed, in no acute distress. HEENT: Conjunctivae and lids unremarkable. Cardiovascular: Regular rhythm.  Lungs: Normal work of breathing. Neurologic: No focal deficits.   Lab Results  Component Value Date   CREATININE 0.91 10/21/2019   BUN 18 10/21/2019   NA 140 10/21/2019   K 4.8 10/21/2019   CL 104 10/21/2019  CO2 22 10/21/2019   Lab Results  Component Value Date   ALT 21 10/21/2019   AST 20 10/21/2019   ALKPHOS 92 10/21/2019   BILITOT 0.5 10/21/2019   Lab Results  Component Value Date   HGBA1C 5.6 10/21/2019   HGBA1C 5.4 11/06/2017   HGBA1C 5.1 09/04/2016   HGBA1C 5.4 07/23/2015   HGBA1C 5.6 07/21/2014   Lab Results  Component Value Date   INSULIN 9.7 10/21/2019   Lab Results  Component Value Date   TSH 1.060 10/21/2019   Lab Results  Component Value Date   CHOL 237 (H)  10/21/2019   HDL 76 10/21/2019   LDLCALC 149 (H) 10/21/2019   TRIG 72 10/21/2019   CHOLHDL 3.3 11/06/2017   Lab Results  Component Value Date   WBC 3.7 10/21/2019   HGB 13.4 10/21/2019   HCT 39.0 10/21/2019   MCV 94 10/21/2019   PLT 245 10/21/2019   Attestation Statements:   Reviewed by clinician on day of visit: allergies, medications, problem list, medical history, surgical history, family history, social history, and previous encounter notes.  Time spent on visit including pre-visit chart review and post-visit care and charting was 20 minutes.   I, Insurance claims handler, CMA, am acting as Energy manager for [employer], DO.  I have reviewed the above documentation for accuracy and completeness, and I agree with the above. Carlye Grippe, D.O.  The 21st Century Cures Act was signed into law in 2016 which includes the topic of electronic health records.  This provides immediate access to information in MyChart.  This includes consultation notes, operative notes, office notes, lab results and pathology reports.  If you have any questions about what you read please let us know at your next visit so we can discuss your concerns and take corrective action if need be.  We are right here with you.

## 2020-03-18 ENCOUNTER — Other Ambulatory Visit: Payer: Self-pay

## 2020-03-18 ENCOUNTER — Ambulatory Visit (INDEPENDENT_AMBULATORY_CARE_PROVIDER_SITE_OTHER): Payer: Managed Care, Other (non HMO) | Admitting: Family Medicine

## 2020-03-18 ENCOUNTER — Encounter (INDEPENDENT_AMBULATORY_CARE_PROVIDER_SITE_OTHER): Payer: Self-pay | Admitting: Family Medicine

## 2020-03-18 VITALS — BP 113/71 | HR 63 | Temp 97.8°F | Ht 64.0 in | Wt 165.0 lb

## 2020-03-18 DIAGNOSIS — E559 Vitamin D deficiency, unspecified: Secondary | ICD-10-CM | POA: Diagnosis not present

## 2020-03-18 DIAGNOSIS — Z6830 Body mass index (BMI) 30.0-30.9, adult: Secondary | ICD-10-CM

## 2020-03-18 DIAGNOSIS — Z9189 Other specified personal risk factors, not elsewhere classified: Secondary | ICD-10-CM

## 2020-03-18 DIAGNOSIS — E669 Obesity, unspecified: Secondary | ICD-10-CM

## 2020-03-18 MED ORDER — VITAMIN D (ERGOCALCIFEROL) 1.25 MG (50000 UNIT) PO CAPS: 50000.0000 [IU] | ORAL_CAPSULE | ORAL | 0 refills | Status: DC

## 2020-03-23 NOTE — Progress Notes (Signed)
Chief Complaint:   OBESITY Betty Price is here to discuss her progress with her obesity treatment plan along with follow-up of her obesity related diagnoses. Betty Price is on the Category 1 Plan and states she is following her eating plan approximately 70% of the time. Betty Price states she is playing tennis for 90 minutes 2 days per week and going to the gym for 90 minutes 5 times per week.  Today's visit was #: 9 Starting weight: 179 lbs Starting date: 10/21/2019 Today's weight: 165 lbs Today's date: 03/18/2020 Total lbs lost to date: 14 lbs Total lbs lost since last in-office visit: +2 lbs Total weight loss percentage to date: -7.82%  Interim History: Betty Price is exercising for 4 days per week for 90 minutes or so each time.  She is frustrated as she gained, but she has gained 2 pounds in water weight.  Assessment/Plan:   1. Vitamin D deficiency Lilley's Vitamin D level was 47.1 on 10/21/2019. She is currently taking prescription vitamin D 50,000 IU each week. She denies nausea, vomiting or muscle weakness.  Plan:  Low Vitamin D level contributes to fatigue and are associated with obesity, breast, and colon cancer. She agrees to continue to take prescription Vitamin D @50 ,000 IU every week and will follow-up for routine testing of Vitamin D, at least 2-3 times per year to avoid over-replacement.  -Refill Vitamin D, Ergocalciferol, (DRISDOL) 1.25 MG (50000 UNIT) CAPS capsule; Take 1 capsule (50,000 Units total) by mouth every 7 (seven) days.  Dispense: 4 capsule; Refill: 0  2. At risk for osteoporosis Betty Price was given approximately 15 minutes of osteoporosis prevention counseling today.  Betty Price is at risk for osteopenia and osteoporosis due to Vitamin D deficiency, as well as other risk factors.  We discussed the importance of prudent screenings through her PCP's office for prevention.  Betty Price was encouraged to take her Vitamin D and follow her calcium rich diet.  We will continue to monitor vitamin D levels to  ensure treatment is appropriate.  It is recommended that she eventually engage in weight bearing exercises and muscle strengthening exercises to help improve bone density and decrease her risk of osteopenia and osteoporosis.  3. Class 1 obesity with serious comorbidity and body mass index (BMI) of 30.0 to 30.9 in adult, unspecified obesity type  Betty Price is currently in the action stage of change. As such, her goal is to continue with weight loss efforts. She has agreed to the Category 1 Plan.   Exercise goals: As is.  Behavioral modification strategies: meal planning and cooking strategies, holiday eating strategies  and planning for success.  Betty Price has agreed to follow-up with our clinic in 3 weeks. She was informed of the importance of frequent follow-up visits to maximize her success with intensive lifestyle modifications for her multiple health conditions.   Objective:   Blood pressure 113/71, pulse 63, temperature 97.8 F (36.6 C), height 5\' 4"  (1.626 m), weight 165 lb (74.8 kg), SpO2 98 %. Body mass index is 28.32 kg/m.  General: Cooperative, alert, well developed, in no acute distress. HEENT: Conjunctivae and lids unremarkable. Cardiovascular: Regular rhythm.  Lungs: Normal work of breathing. Neurologic: No focal deficits.   Lab Results  Component Value Date   CREATININE 0.91 10/21/2019   BUN 18 10/21/2019   NA 140 10/21/2019   K 4.8 10/21/2019   CL 104 10/21/2019   CO2 22 10/21/2019   Lab Results  Component Value Date   ALT 21 10/21/2019   AST 20  10/21/2019   ALKPHOS 92 10/21/2019   BILITOT 0.5 10/21/2019   Lab Results  Component Value Date   HGBA1C 5.6 10/21/2019   HGBA1C 5.4 11/06/2017   HGBA1C 5.1 09/04/2016   HGBA1C 5.4 07/23/2015   HGBA1C 5.6 07/21/2014   Lab Results  Component Value Date   INSULIN 9.7 10/21/2019   Lab Results  Component Value Date   TSH 1.060 10/21/2019   Lab Results  Component Value Date   CHOL 237 (H) 10/21/2019   HDL 76  10/21/2019   LDLCALC 149 (H) 10/21/2019   TRIG 72 10/21/2019   CHOLHDL 3.3 11/06/2017   Lab Results  Component Value Date   WBC 3.7 10/21/2019   HGB 13.4 10/21/2019   HCT 39.0 10/21/2019   MCV 94 10/21/2019   PLT 245 10/21/2019   Attestation Statements:   Reviewed by clinician on day of visit: allergies, medications, problem list, medical history, surgical history, family history, social history, and previous encounter notes.  I, Insurance claims handler, CMA, am acting as Energy manager for [employer], DO.  I have reviewed the above documentation for accuracy and completeness, and I agree with the above. -  *Carlye Grippe, D.O.  The 21st Century Cures Act was signed into law in 2016 which includes the topic of electronic health records.  This provides immediate access to information in MyChart.  This includes consultation notes, operative notes, office notes, lab results and pathology reports.  If you have any questions about what you read please let us know at your next visit so we can discuss your concerns and take corrective action if need be.  We are right here with you.

## 2020-04-07 ENCOUNTER — Ambulatory Visit (INDEPENDENT_AMBULATORY_CARE_PROVIDER_SITE_OTHER): Payer: Managed Care, Other (non HMO) | Admitting: Family Medicine

## 2020-04-20 ENCOUNTER — Ambulatory Visit (INDEPENDENT_AMBULATORY_CARE_PROVIDER_SITE_OTHER): Payer: Managed Care, Other (non HMO) | Admitting: Family Medicine

## 2020-04-27 ENCOUNTER — Encounter (INDEPENDENT_AMBULATORY_CARE_PROVIDER_SITE_OTHER): Payer: Self-pay

## 2020-05-12 ENCOUNTER — Ambulatory Visit (INDEPENDENT_AMBULATORY_CARE_PROVIDER_SITE_OTHER): Payer: Managed Care, Other (non HMO) | Admitting: Family Medicine

## 2020-05-18 ENCOUNTER — Encounter (INDEPENDENT_AMBULATORY_CARE_PROVIDER_SITE_OTHER): Payer: Self-pay

## 2020-05-19 ENCOUNTER — Other Ambulatory Visit: Payer: Self-pay

## 2020-05-19 ENCOUNTER — Telehealth (INDEPENDENT_AMBULATORY_CARE_PROVIDER_SITE_OTHER): Payer: Managed Care, Other (non HMO) | Admitting: Family Medicine

## 2020-05-19 ENCOUNTER — Encounter (INDEPENDENT_AMBULATORY_CARE_PROVIDER_SITE_OTHER): Payer: Self-pay | Admitting: Family Medicine

## 2020-05-19 ENCOUNTER — Telehealth (INDEPENDENT_AMBULATORY_CARE_PROVIDER_SITE_OTHER): Payer: Self-pay

## 2020-05-19 DIAGNOSIS — E559 Vitamin D deficiency, unspecified: Secondary | ICD-10-CM | POA: Diagnosis not present

## 2020-05-19 DIAGNOSIS — Z9189 Other specified personal risk factors, not elsewhere classified: Secondary | ICD-10-CM

## 2020-05-19 DIAGNOSIS — M25562 Pain in left knee: Secondary | ICD-10-CM | POA: Diagnosis not present

## 2020-05-19 DIAGNOSIS — E669 Obesity, unspecified: Secondary | ICD-10-CM | POA: Diagnosis not present

## 2020-05-19 DIAGNOSIS — Z6830 Body mass index (BMI) 30.0-30.9, adult: Secondary | ICD-10-CM

## 2020-05-19 MED ORDER — VITAMIN D (ERGOCALCIFEROL) 1.25 MG (50000 UNIT) PO CAPS: 50000.0000 [IU] | ORAL_CAPSULE | ORAL | 0 refills | Status: DC

## 2020-05-19 NOTE — Telephone Encounter (Signed)
Verbal consent obtained to conduct video visit, via telehealth 

## 2020-05-22 NOTE — Progress Notes (Signed)
TeleHealth Visit:  Due to the COVID-19 pandemic, this visit was completed with telemedicine (audio/video) technology to reduce patient and provider exposure as well as to preserve personal protective equipment.   Betty Price has verbally consented to this TeleHealth visit. The patient is located at home, the provider is located at the [facility] office. The participants in this visit include the listed provider and patient. The visit was conducted today via video.   Chief Complaint: OBESITY Betty Price is here to discuss her progress with her obesity treatment plan along with follow-up of her obesity related diagnoses. Betty Price is on the Category 1 Plan and states she is following her eating plan approximately 65% of the time. Betty Price states she is exercising 0 minutes 0 times per week.  Today's visit was #: 10 Starting weight: 179 lbs Starting date: 10/21/2019  Interim History: Pt's last OV was 03/18/2020. She thinks she maintained her weight over the holidays. She denies issues with the plan/foods. Doing very well. Pt is still trying to get 80-100 oz of water a day. Recent knee injury derailed her exercise regimen.  Assessment/Plan:   1. Vitamin D deficiency Betty Price's Vitamin D level was 47.1 on 10/21/2019. She is currently taking prescription vitamin D 50,000 IU each week. She denies nausea, vomiting or muscle weakness.  Ref. Range 10/21/2019 15:41  Vitamin D, 25-Hydroxy Latest Ref Range: 30.0 - 100.0 ng/mL 47.1   Plan: Refill Vit D for 1 month, as per below. Low Vitamin D level contributes to fatigue and are associated with obesity, breast, and colon cancer. She agrees to continue to take prescription Vitamin D @50 ,000 IU every week and will follow-up for routine testing of Vitamin D, at least 2-3 times per year to avoid over-replacement.  Refill- Vitamin D, Ergocalciferol, (DRISDOL) 1.25 MG (50000 UNIT) CAPS capsule; Take 1 capsule (50,000 Units total) by mouth every 7 (seven) days.   Dispense: 4 capsule; Refill: 0  2. Left knee pain, unspecified chronicity Status post meniscectomy a couple of years ago. "Tweaked" it again playing tennis a week ago. Pt hasn't seen Dr. for it yet.  Plan: Advised pt strengthening exercises with limited ROM leg curl/leg ext and straight leg raises to work on muscle strength in hams/quads. OK to bike with proper ROM to protect knee r/w pt. Ok to swim. She will focus on strength training more over the next several weeks until next OV. Ice 15-20 minutes every 3-4 hours prn. Follow up with Dr. Terrilee Files prn. Consider P.T. if she desires more formal exercise regimen.  3. At risk for activity intolerance Betty Price was given approximately 15 minutes of exercise intolerance counseling today. She is 55 y.o. female and has risk factors exercise intolerance including obesity and knee injury. We discussed intensive lifestyle modifications today with an emphasis on specific weight loss instructions and strategies. Betty Price will slowly increase activity as tolerated.  4. Class 1 obesity with serious comorbidity and body mass index (BMI) of 30.0 to 30.9 in adult, unspecified obesity type Betty Price is currently in the action stage of change. As such, her goal is to continue with weight loss efforts. She has agreed to the Category 1 Plan.   Exercise goals: As is  Behavioral modification strategies: increasing lean protein intake, decreasing simple carbohydrates, increasing water intake, meal planning and cooking strategies, keeping healthy foods in the home and planning for success.  Betty Price has agreed to follow-up with our clinic in 2-4 weeks; pt has a f/up appt on 06/09/2020 at  0700. She was informed of the importance of frequent follow-up visits to maximize her success with intensive lifestyle modifications for her multiple health conditions.  Objective:   VITALS: Per patient if applicable, see vitals. GENERAL: Alert and in no acute distress. CARDIOPULMONARY: No  increased WOB. Speaking in clear sentences.  PSYCH: Pleasant and cooperative. Speech normal rate and rhythm. Affect is appropriate. Insight and judgement are appropriate. Attention is focused, linear, and appropriate.  NEURO: Oriented as arrived to appointment on time with no prompting.   Lab Results  Component Value Date   CREATININE 0.91 10/21/2019   BUN 18 10/21/2019   NA 140 10/21/2019   K 4.8 10/21/2019   CL 104 10/21/2019   CO2 22 10/21/2019   Lab Results  Component Value Date   ALT 21 10/21/2019   AST 20 10/21/2019   ALKPHOS 92 10/21/2019   BILITOT 0.5 10/21/2019   Lab Results  Component Value Date   HGBA1C 5.6 10/21/2019   HGBA1C 5.4 11/06/2017   HGBA1C 5.1 09/04/2016   HGBA1C 5.4 07/23/2015   HGBA1C 5.6 07/21/2014   Lab Results  Component Value Date   INSULIN 9.7 10/21/2019   Lab Results  Component Value Date   TSH 1.060 10/21/2019   Lab Results  Component Value Date   CHOL 237 (H) 10/21/2019   HDL 76 10/21/2019   LDLCALC 149 (H) 10/21/2019   TRIG 72 10/21/2019   CHOLHDL 3.3 11/06/2017   Lab Results  Component Value Date   WBC 3.7 10/21/2019   HGB 13.4 10/21/2019   HCT 39.0 10/21/2019   MCV 94 10/21/2019   PLT 245 10/21/2019   No results found for: IRON, TIBC, FERRITIN  Attestation Statements:   Reviewed by clinician on day of visit: allergies, medications, problem list, medical history, surgical history, family history, social history, and previous encounter notes.  Edmund Hilda, am acting as Energy manager for [employer], DO.  I have reviewed the above documentation for accuracy and completeness, and I agree with the above. Carlye Grippe, D.O.  The 21st Century Cures Act was signed into law in 2016 which includes the topic of electronic health records.  This provides immediate access to information in MyChart.  This includes consultation notes, operative notes, office notes, lab results and pathology reports.  If you have  any questions about what you read please let us know at your next visit so we can discuss your concerns and take corrective action if need be.  We are right here with you.

## 2020-05-23 NOTE — Progress Notes (Signed)
[name] [address] [place] [address] Phone: [phone] Subjective:   I Ronelle Nigh am serving as a Neurosurgeon for Dr. Antoine Primas.  This visit occurred during the SARS-CoV-2 public health emergency.  Safety protocols were in place, including screening questions prior to the visit, additional usage of staff PPE, and extensive cleaning of exam room while observing appropriate contact time as indicated for disinfecting solutions.   I'm seeing this patient by the request  of:  Lewis Moccasin, MD  CC: Left knee pain follow-up  HER:[id number]   10/23/2019 Low back pain as well.  May help with physical therapy.  Patient is making progress.  Encourage weight loss.  Patient is dedicated and motivated at this time that I do believe will have good outcome.  Follow-up again in 4 to 8 weeks.  Chronic problem patient is still having difficulty with.  We discussed medications including the gabapentin.  Discussed which activities to do which wants to avoid.  Increase activity slowly.  Discussed the meloxicam.  Sent to formal physical therapy  Update 05/24/2020 AIRIKA ALKHATIB is a 55 y.o. female coming in with complaint of left knee pain. States she has medial knee pain. Has been seen for the knee before. Resting for the past 4-5 days. Preparing to start back with tennis and needs to know what to do.  Patient states that she does not notice any improvement at this point.  Still catches her with certain movements.  Denies though giving out on her.  Denies any recent swelling.     Past Medical History:  Diagnosis Date  . Acute lateral meniscus tear of left knee   . Allergy   . Back pain   . Emotional stress   . Food allergy   . Gluten free diet   . Hives 2012   SAW DR. HICKS  . Joint pain    Past Surgical History:  Procedure Laterality Date  . GYNECOLOGIC CRYOSURGERY  05/08/2006   cin 1  . lasix surgery    . MENISCUS REPAIR  10/2009   in 2014 left  knee   Social History   Socioeconomic History  . Marital status: Married    Spouse name: Not on file  . Number of children: Not on file  . Years of education: Not on file  . Highest education level: Not on file  Occupational History  . Occupation: Haematologist  Tobacco Use  . Smoking status: Never Smoker  . Smokeless tobacco: Never Used  Vaping Use  . Vaping Use: Never used  Substance and Sexual Activity  . Alcohol use: Yes    Alcohol/week: 0.0 standard drinks    Comment: occassional  . Drug use: No  . Sexual activity: Yes    Comment: INTERCOURSE AGE 28, SEXUAL PARTNERS MORE THAN 5  Other Topics Concern  . Not on file  Social History Narrative  . Not on file   Social Determinants of Health   Financial Resource Strain: Not on file  Food Insecurity: Not on file  Transportation Needs: Not on file  Physical Activity: Not on file  Stress: Not on file  Social Connections: Not on file   No Known Allergies Family History  Problem Relation Age of Onset  . Heart disease Father        died of MI  . Hypertension Father   . Allergic rhinitis Father   . Sudden death Father   . Diabetes Maternal Grandmother   .  Allergic rhinitis Mother   . Obesity Mother   . Colon cancer Neg Hx   . Esophageal cancer Neg Hx   . Pancreatic cancer Neg Hx   . Rectal cancer Neg Hx   . Stomach cancer Neg Hx     Current Outpatient Medications (Endocrine & Metabolic):  .  NP THYROID 30 MG tablet, Take 30 mg by mouth every morning. Marland Kitchen  PROGESTERONE PO, 100 mg.      Current Outpatient Medications (Other):  Marland Kitchen  MAGNESIUM GLYCINATE PO, Take 100 mg by mouth daily. Marland Kitchen  OVER THE COUNTER MEDICATION, Magnesium Citrate 100 mg. One tablet daily. .  Vitamin D, Ergocalciferol, (DRISDOL) 1.25 MG (50000 UNIT) CAPS capsule, Take 1 capsule (50,000 Units total) by mouth every 7 (seven) days.   Reviewed prior external information including notes and imaging from  primary care provider As well  as notes that were available from care everywhere and other healthcare systems.  Past medical history, social, surgical and family history all reviewed in electronic medical record.  No pertanent information unless stated regarding to the chief complaint.   Review of Systems:  No headache, visual changes, nausea, vomiting, diarrhea, constipation, dizziness, abdominal pain, skin rash, fevers, chills, night sweats, weight loss, swollen lymph nodes, body aches, joint swelling, chest pain, shortness of breath, mood changes. POSITIVE muscle aches  Objective  Blood pressure 138/78, pulse 74, height 5\' 4"  (1.626 m), weight 171 lb (77.6 kg), SpO2 100 %.   General: No apparent distress alert and oriented x3 mood and affect normal, dressed appropriately.  HEENT: Pupils equal, extraocular movements intact  Respiratory: Patient's speak in full sentences and does not appear short of breath  Cardiovascular: No lower extremity edema, non tender, no erythema  Gait normal with good balance and coordination.  MSK: Left knee exam has some tenderness to palpation in the medial aspect of the knee.  Patient does have very mild positive McMurray's noted.  Patient does have good range of motion.  No significant instability noted at the moment.  Limited musculoskeletal ultrasound was performed and interpreted by  Limited ultrasound of patient's left knee shows that patient does have hypoechoic changes of the medial meniscus that seems different than previous exam.  Very mild displacement noted.  Seems to be an acute on chronic tear.  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

## 2020-05-24 ENCOUNTER — Encounter: Payer: Self-pay | Admitting: Family Medicine

## 2020-05-24 ENCOUNTER — Ambulatory Visit (INDEPENDENT_AMBULATORY_CARE_PROVIDER_SITE_OTHER): Payer: Managed Care, Other (non HMO) | Admitting: Family Medicine

## 2020-05-24 ENCOUNTER — Ambulatory Visit (INDEPENDENT_AMBULATORY_CARE_PROVIDER_SITE_OTHER): Payer: Managed Care, Other (non HMO)

## 2020-05-24 ENCOUNTER — Other Ambulatory Visit: Payer: Self-pay

## 2020-05-24 ENCOUNTER — Ambulatory Visit: Payer: Self-pay

## 2020-05-24 VITALS — BP 138/78 | HR 74 | Ht 64.0 in | Wt 171.0 lb

## 2020-05-24 DIAGNOSIS — G8929 Other chronic pain: Secondary | ICD-10-CM

## 2020-05-24 DIAGNOSIS — M23204 Derangement of unspecified medial meniscus due to old tear or injury, left knee: Secondary | ICD-10-CM

## 2020-05-24 DIAGNOSIS — M25562 Pain in left knee: Secondary | ICD-10-CM

## 2020-05-24 NOTE — Patient Instructions (Addendum)
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

## 2020-05-24 NOTE — Assessment & Plan Note (Signed)
Patient does have some mild exacerbation of this underlying problem.  It does appear to have more of an acute on chronic meniscal tear.  We will get x-rays to further evaluate the amount of arthritic changes.  Discussed icing regimen, topical anti-inflammatories and will start with formal physical therapy.  Worsening symptoms will consider injection, possible advanced imaging.  Follow-up again in 4 to 6 weeks

## 2020-05-31 ENCOUNTER — Telehealth: Payer: Self-pay

## 2020-05-31 NOTE — Telephone Encounter (Signed)
Patient called stating that she could not find her knee brace and was told to call back to let us know so we could get her one for her L knee. informed Katrinka Blazing is not here today and she was okay with tomorrow.

## 2020-06-01 NOTE — Telephone Encounter (Signed)
Sent patient MyChart message about hinged knee brace fitting.

## 2020-06-02 ENCOUNTER — Other Ambulatory Visit: Payer: Self-pay

## 2020-06-02 ENCOUNTER — Ambulatory Visit: Payer: Managed Care, Other (non HMO) | Attending: Family Medicine

## 2020-06-02 DIAGNOSIS — R2689 Other abnormalities of gait and mobility: Secondary | ICD-10-CM | POA: Diagnosis present

## 2020-06-02 DIAGNOSIS — G8929 Other chronic pain: Secondary | ICD-10-CM | POA: Insufficient documentation

## 2020-06-02 DIAGNOSIS — M25662 Stiffness of left knee, not elsewhere classified: Secondary | ICD-10-CM | POA: Diagnosis present

## 2020-06-02 DIAGNOSIS — M25562 Pain in left knee: Secondary | ICD-10-CM | POA: Insufficient documentation

## 2020-06-02 DIAGNOSIS — M6281 Muscle weakness (generalized): Secondary | ICD-10-CM | POA: Insufficient documentation

## 2020-06-02 NOTE — Therapy (Signed)
[facility] Outpatient Rehabilitation [facility] [address] [place], [place], [address] Phone: [phone]   Fax:  [phone]  Physical Therapy Evaluation  Patient Details  Name: Betty Price MRN: [id number] Date of Birth: 01-Mar-1966 Referring Provider (PT): Judi Saa, DO   Encounter Date: 06/02/2020   PT End of Session - 06/02/20 1117    Visit Number 1    Number of Visits 13    Date for PT Re-Evaluation 07/17/20    Authorization Type [employer] - VASO note covered; FOTO visit 6 and visit 10    PT Start Time 1130    PT Stop Time 1213    PT Time Calculation (min) 43 min    Activity Tolerance Patient tolerated treatment well    Behavior During Therapy [facility] for tasks assessed/performed           Past Medical History:  Diagnosis Date  . Acute lateral meniscus tear of left knee   . Allergy   . Back pain   . Emotional stress   . Food allergy   . Gluten free diet   . Hives 2012   SAW DR. HICKS  . Joint pain     Past Surgical History:  Procedure Laterality Date  . GYNECOLOGIC CRYOSURGERY  05/08/2006   cin 1  . lasix surgery    . MENISCUS REPAIR  10/2009   in 2014 left knee    There were no vitals filed for this visit.    Subjective Assessment - 06/02/20 1132    Subjective "I think it's a tennis injury. I had arthroscopic surgery on the same meniscus about 10 years ago. I also did some exercises for another meniscus injury in the same knee about a year ago, but it has been fine ever since doing exercises and vitamin D. This one started 5-6 weeks ago. I have been icing and been at the gym doing some quad/HS exercises minimally. Doctor said I can play tennis, and I have played once. I was careful and did not feel worse afterwards compared to before. I have been keeping inflammation down and really just have pain where the injury is. I'm on 5 tennis teams in the spring, so I play at least 4x/week.    Pertinent History See PMH above    Limitations Walking     How long can you sit comfortably? No limitations    How long can you stand comfortably? No limitations    How long can you walk comfortably? No issues with household ambulation but hasn't tried prolonged walking - used to walk 1.5 hours    Diagnostic tests [name] 05/24/2020 reveals L knee medial meniscal tear. Negative radiograph of L knee 05/24/2020.    Patient Stated Goals Return to tennis at full capacity and going for walks; return to regular gym routine    Currently in Pain? No/denies    Pain Score 0-No pain    Aggravating Factors  Moderate/high intensity activity, prolonged walking, tennis    Pain Relieving Factors Vitamin D, application of ice    Effect of Pain on Daily Activities Unable to play tennis at full capacity              [facility] PT Assessment - 06/02/20 0001      Assessment   Medical Diagnosis Chronic pain of left knee (M25.562, G89.29)    Referring Provider (PT) Judi Saa, DO    Onset Date/Surgical Date --   5-6 weeks ago   Hand Dominance Right  Next MD Visit 06/02/2020 then 06/24/2020    Prior Therapy Yes after surgery 10 years ago      Precautions   Precautions None   cleared to play tennis     Restrictions   Weight Bearing Restrictions No      Balance Screen   Has the patient fallen in the past 6 months No    Has the patient had a decrease in activity level because of a fear of falling?  Yes    Is the patient reluctant to leave their home because of a fear of falling?  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    Leisure Tennis, going for walks, gym, going with husband when he goes fishing, reading, playing with her dog      Cognition   Overall Cognitive Status Within Functional Limits for tasks assessed      Observation/Other Assessments   Focus on Therapeutic Outcomes (FOTO)  69% functional status; predicted 81% function      ROM / Strength   AROM / PROM / Strength AROM;Strength      AROM   AROM Assessment Site Knee    Right/Left Knee Right;Left    Right Knee Extension 0    Right Knee Flexion 145    Left Knee Extension 0    Left Knee Flexion 141      Strength   Strength Assessment Site Hip;Knee;Ankle    Right/Left Hip Right;Left    Right Hip Flexion 5/5    Right Hip Extension 5/5    Right Hip ABduction 5/5    Right Hip ADduction 5/5    Left Hip Flexion 5/5    Left Hip Extension 4+/5    Left Hip ABduction 4+/5    Left Hip ADduction 4+/5    Right/Left Knee Right;Left    Right Knee Flexion 5/5    Right Knee Extension 5/5    Left Knee Flexion 5/5    Left Knee Extension 5/5    Right/Left Ankle Right;Left    Right Ankle Dorsiflexion 5/5    Right Ankle Plantar Flexion 5/5   modified test in sitting   Left Ankle Dorsiflexion 5/5    Left Ankle Plantar Flexion 5/5   modified test in sitting     Palpation   Palpation comment Minimal TTP along L knee medial joint line      Special Tests   Other special tests Not performed as medial meniscal tear is confirmed via US 05/24/2020.      High Level Balance   High Level Balance Comments No significant balance deficits noted during evaluation but pt could benefit from SL activities for increased stability as she returns to tennis.                      Objective measurements completed on examination: See above findings.       [facility]  Treatment/Exercise - 06/02/20 0001      Self-Care   Self-Care Other Self-Care Comments    Other Self-Care Comments  See patient education      Exercises    Exercises Knee/Hip      Knee/Hip Exercises: Supine   Straight Leg Raises Strengthening;Left;10 reps    Straight Leg Raises Limitations 3# ankle weight      Knee/Hip Exercises: Sidelying   Hip ABduction Strengthening;Left;10 reps    Hip ABduction Limitations 3# ankle weight    Hip ADduction Strengthening;Left;10 reps    Hip ADduction Limitations 3# ankle weight      Knee/Hip Exercises: Prone   Straight Leg Raises Strengthening;Left;10 reps    Straight Leg Raises Limitations 3# ankle weight      Manual Therapy   Manual therapy comments Meniscal squeeze MWM - Pressure along L knee medial joint line as patient performs A/AROM L knee FL/EXT                  PT Education - 06/02/20 1401    Education Details HEP, anatomy of condition (pt asked healing potential - reviewed red-red, red-white, and white-white zones but referred pt to ask doctor about her specific area of tear if she wishes to), return to tennis as cleared to play by doctor but to avoid planting and twisting especially, FOTO and potential progress, POC. Application of heat along L knee 10-15 min duration to promote increased blood flow unless inflammation is noted.    Person(s) Educated Patient    Methods Explanation;Demonstration    Comprehension Verbalized understanding;Returned demonstration            PT Short Term Goals - 06/02/20 1429      PT SHORT TERM GOAL #1   Title Patient will be independent with initial HEP.    Baseline Pt provided initial HEP at evaluation 06/02/2020.    Time 3    Period Weeks    Status New    Target Date 06/23/20      PT SHORT TERM GOAL #2   Title Patient will report being able to perform at >/= 50% capacity during tennis practice with </= 4/10 L knee pain.    Baseline Pt has only tried playing tennis once with caution but plans to attend Wednesday practices to ease back into it    Time 3    Period Weeks    Status New    Target Date 06/23/20      PT SHORT TERM GOAL #3    Title Patient will demonstrate L knee FL AROM to 145 degrees without discomfort or pain.    Baseline Minimal discomfort noted at 141 degrees L knee FL    Time 3    Period Weeks    Status New    Target Date 06/23/20      PT SHORT TERM GOAL #4   Title Patient will be able to walk for at least 1 hour with </= 3/10 L knee pain.    Baseline Pt has not attempted prolonged walking at this time - she used to walk at least 1.5 hours regularly    Time 3    Period Weeks    Status New    Target Date 06/23/20             PT Long Term Goals - 06/02/20 1433      PT LONG TERM GOAL #1   Title Patient will be independent with advanced HEP.    Baseline Pt provided  initial HEP at evaluation 06/02/2020.    Time 6    Period Weeks    Status New    Target Date 07/14/20      PT LONG TERM GOAL #2   Title Patient will report being able to perform at >/= 75% capacity during tennis practice with </= 3/10 L knee pain.    Baseline Pt has only tried playing tennis once with caution but plans to attend Wednesday practices to ease back into it    Time 6    Period Weeks    Status New    Target Date 07/14/20      PT LONG TERM GOAL #3   Title Patient will improve LLE MMT to 5/5 grossly.    Baseline See flowsheet    Time 6    Period Weeks    Status New    Target Date 07/14/20      PT LONG TERM GOAL #4   Title Patient's FOTO score will increase from 69% to 81% functional status to demonstrate improved perceived ability.    Baseline 69% functional status; predicted 81% function    Time 6    Period Weeks    Status New    Target Date 07/14/20      PT LONG TERM GOAL #5   Title Patient will report being able to resume to regular walking and gym routine without significant pain or discomfort.    Baseline Pt has not attempted regular walking/gym routine at this time    Time 6    Period Weeks    Status New    Target Date 07/14/20                  Plan - 06/02/20 1127    Clinical Impression  Statement Patient is a 55 year old female who presents to OPPT with complaints of chronic L knee pain. Ultrasound 05/24/2020 reveals medial meniscal tear that is acute on chronic per chart. She has had previous meniscal injuries in L knee with good recovery and ability to return to playing tennis regularly. Patient is cleared to return to tennis but plans to only begin with Wednesday practices over the next month to see how she feels. MMT and ROM assessment reveals good strength and mobility in LLE grossly with only 4 degrees less L knee FL and slight discomfort in comparison to R knee FL. Pt has an appointment today to retrieve a fitted hinged knee brace from doctor, and she plans to attend tennis practice tonight. Increased frequency initially to determine response and progress with interventions to allow for safe return to more frequent tennis practices as she typically plays at least 4x/week. She should benefit from skilled PT intervention to address deficits and help her achieve functional goals.    Personal Factors and Comorbidities Age;Past/Current Experience;Time since onset of injury/illness/exacerbation    Examination-Activity Limitations Locomotion Level;Squat    Examination-Participation Restrictions Community Activity;Other   tennis, gym, prolonged walking   Stability/Clinical Decision Making Stable/Uncomplicated    Clinical Decision Making Low    Rehab Potential Excellent    PT Frequency 2x / week    PT Duration 6 weeks    PT Treatment/Interventions ADLs/Self Care Home Management;Aquatic Therapy;Cryotherapy;Electrical Stimulation;Iontophoresis 4mg /ml Dexamethasone;Moist Heat;Neuromuscular re-education;Balance training;Therapeutic exercise;Therapeutic activities;Functional mobility training;Stair training;Gait training;Patient/family education;Manual techniques;Energy conservation;Dry needling;Passive range of motion;Taping    PT Next Visit Plan Assess response to HEP and update. Progress LLE  stability exercises as tolerated and incorporate LE stretches (HS, hip FL, butterfly stretch,  ITB) as indicated.    PT Home Exercise Plan [id number] - hip 4-way (FL, EXT, ABD, ADD) with 3# resistance, active knee FL/EXT with light pressure applied to L medial joint line, application of heat to promote increased blood flow as long as pt doesn't experience inflammation    Consulted and Agree with Plan of Care Patient           Patient will benefit from skilled therapeutic intervention in order to improve the following deficits and impairments:  Decreased activity tolerance,Decreased mobility,Decreased endurance,Difficulty walking,Pain  Visit Diagnosis: Chronic pain of left knee - Plan: PT plan of care cert/re-cert  Stiffness of left knee, not elsewhere classified - Plan: PT plan of care cert/re-cert  Other abnormalities of gait and mobility - Plan: PT plan of care cert/re-cert  Muscle weakness (generalized) - Plan: PT plan of care cert/re-cert     Problem List Patient Active Problem List   Diagnosis Date Noted  . Vitamin D deficiency 05/19/2020  . Left knee pain 05/19/2020  . At risk for activity intolerance 05/19/2020  . Low back pain 09/25/2019  . Nonallopathic lesion of sacral region 09/25/2019  . Nonallopathic lesion of thoracic region 09/25/2019  . Nonallopathic lesion of lumbar region 09/25/2019  . Degenerative tear of medial meniscus, left 08/21/2019  . Cervical dysplasia 06/10/2012    Betty Price, PT, DPT 06/02/20 2:39 PM  [facility] Outpatient Rehabilitation [facility] [address] [place], [place], [address] Phone: [phone]   Fax:  [phone]  Name: Betty Price MRN: [id number] Date of Birth: Jan 12, 1966

## 2020-06-09 ENCOUNTER — Ambulatory Visit (INDEPENDENT_AMBULATORY_CARE_PROVIDER_SITE_OTHER): Payer: Managed Care, Other (non HMO) | Admitting: Family Medicine

## 2020-06-09 ENCOUNTER — Encounter (INDEPENDENT_AMBULATORY_CARE_PROVIDER_SITE_OTHER): Payer: Self-pay | Admitting: Family Medicine

## 2020-06-09 ENCOUNTER — Other Ambulatory Visit: Payer: Self-pay

## 2020-06-09 ENCOUNTER — Ambulatory Visit: Payer: Managed Care, Other (non HMO) | Admitting: Physical Therapy

## 2020-06-09 VITALS — BP 135/89 | HR 65 | Temp 97.9°F | Ht 64.0 in | Wt 168.0 lb

## 2020-06-09 DIAGNOSIS — Z6830 Body mass index (BMI) 30.0-30.9, adult: Secondary | ICD-10-CM

## 2020-06-09 DIAGNOSIS — M6281 Muscle weakness (generalized): Secondary | ICD-10-CM

## 2020-06-09 DIAGNOSIS — R2689 Other abnormalities of gait and mobility: Secondary | ICD-10-CM

## 2020-06-09 DIAGNOSIS — M25562 Pain in left knee: Secondary | ICD-10-CM

## 2020-06-09 DIAGNOSIS — E669 Obesity, unspecified: Secondary | ICD-10-CM

## 2020-06-09 DIAGNOSIS — E559 Vitamin D deficiency, unspecified: Secondary | ICD-10-CM

## 2020-06-09 DIAGNOSIS — M25662 Stiffness of left knee, not elsewhere classified: Secondary | ICD-10-CM

## 2020-06-09 DIAGNOSIS — Z9189 Other specified personal risk factors, not elsewhere classified: Secondary | ICD-10-CM

## 2020-06-09 DIAGNOSIS — G8929 Other chronic pain: Secondary | ICD-10-CM

## 2020-06-09 MED ORDER — VITAMIN D (ERGOCALCIFEROL) 1.25 MG (50000 UNIT) PO CAPS: 50000.0000 [IU] | ORAL_CAPSULE | ORAL | 0 refills | Status: DC

## 2020-06-09 NOTE — Therapy (Signed)
[facility] Outpatient Rehabilitation [facility] [address] [place], [place], [address] Phone: [phone]   Fax:  [phone]  Physical Therapy Treatment  Patient Details  Name: Betty Price MRN: [id number] Date of Birth: Feb 28, 1966 Referring Provider (PT): Judi Saa, DO   Encounter Date: 06/09/2020   PT End of Session - 06/09/20 1357    Visit Number 2    Number of Visits 13    Date for PT Re-Evaluation 07/17/20    Authorization Type [employer] - VASO note covered; FOTO visit 6 and visit 10    PT Start Time 1000    PT Stop Time 1043    PT Time Calculation (min) 43 min    Activity Tolerance Patient tolerated treatment well    Behavior During Therapy [facility] for tasks assessed/performed           Past Medical History:  Diagnosis Date  . Acute lateral meniscus tear of left knee   . Allergy   . Back pain   . Emotional stress   . Food allergy   . Gluten free diet   . Hives 2012   SAW DR. HICKS  . Joint pain     Past Surgical History:  Procedure Laterality Date  . GYNECOLOGIC CRYOSURGERY  05/08/2006   cin 1  . lasix surgery    . MENISCUS REPAIR  10/2009   in 2014 left knee    There were no vitals filed for this visit.   Subjective Assessment - 06/09/20 1355    Subjective Very min pain when I walk and twist a certain way.  Have done the exercises 4-5 times.  Got my brace.    Currently in Pain? No/denies              [facility] Adult PT Treatment/Exercise - 06/09/20 0001      Knee/Hip Exercises: Stretches   Active Hamstring Stretch 2 reps    Active Hamstring Stretch Limitations 60 sec    Quad Stretch 2 reps;60 seconds    ITB Stretch 3 reps;30 seconds      Knee/Hip Exercises: Aerobic   Stationary Bike 6 min L2      Knee/Hip Exercises: Supine   Straight Leg Raises Strengthening;Left;10 reps    Straight Leg Raises Limitations 3# ankle weight    Straight Leg Raise with External Rotation Strengthening;Left;1 set;15 reps    Straight Leg Raise with  External Rotation Limitations 3 lbs      Knee/Hip Exercises: Sidelying   Hip ABduction Strengthening;Left;10 reps    Hip ABduction Limitations 3# ankle weight    Hip ADduction Strengthening;Left;10 reps    Hip ADduction Limitations 3# ankle weight    Clams x 20      Knee/Hip Exercises: Prone   Straight Leg Raises Strengthening;Left;10 reps    Straight Leg Raises Limitations 3# ankle weight      Iontophoresis   Type of Iontophoresis Dexamethasone    Location Rt medial knee    Dose 1 cc    Time 6 hour patch                  PT Education - 06/09/20 1356    Education Details stretching , HEP , ionto    Person(s) Educated Patient    Methods Explanation    Comprehension Verbalized understanding            PT Short Term Goals - 06/02/20 1429      PT SHORT TERM GOAL #1   Title Patient  will be independent with initial HEP.    Baseline Pt provided initial HEP at evaluation 06/02/2020.    Time 3    Period Weeks    Status New    Target Date 06/23/20      PT SHORT TERM GOAL #2   Title Patient will report being able to perform at >/= 50% capacity during tennis practice with </= 4/10 L knee pain.    Baseline Pt has only tried playing tennis once with caution but plans to attend Wednesday practices to ease back into it    Time 3    Period Weeks    Status New    Target Date 06/23/20      PT SHORT TERM GOAL #3   Title Patient will demonstrate L knee FL AROM to 145 degrees without discomfort or pain.    Baseline Minimal discomfort noted at 141 degrees L knee FL    Time 3    Period Weeks    Status New    Target Date 06/23/20      PT SHORT TERM GOAL #4   Title Patient will be able to walk for at least 1 hour with </= 3/10 L knee pain.    Baseline Pt has not attempted prolonged walking at this time - she used to walk at least 1.5 hours regularly    Time 3    Period Weeks    Status New    Target Date 06/23/20             PT Long Term Goals - 06/02/20 1433       PT LONG TERM GOAL #1   Title Patient will be independent with advanced HEP.    Baseline Pt provided initial HEP at evaluation 06/02/2020.    Time 6    Period Weeks    Status New    Target Date 07/14/20      PT LONG TERM GOAL #2   Title Patient will report being able to perform at >/= 75% capacity during tennis practice with </= 3/10 L knee pain.    Baseline Pt has only tried playing tennis once with caution but plans to attend Wednesday practices to ease back into it    Time 6    Period Weeks    Status New    Target Date 07/14/20      PT LONG TERM GOAL #3   Title Patient will improve LLE MMT to 5/5 grossly.    Baseline See flowsheet    Time 6    Period Weeks    Status New    Target Date 07/14/20      PT LONG TERM GOAL #4   Title Patient's FOTO score will increase from 69% to 81% functional status to demonstrate improved perceived ability.    Baseline 69% functional status; predicted 81% function    Time 6    Period Weeks    Status New    Target Date 07/14/20      PT LONG TERM GOAL #5   Title Patient will report being able to resume to regular walking and gym routine without significant pain or discomfort.    Baseline Pt has not attempted regular walking/gym routine at this time    Time 6    Period Weeks    Status New    Target Date 07/14/20                 Plan - 06/09/20 1022    Clinical Impression  Statement Patient tolerated mat level exercises very well.  Will be wearing hinged knee brace whenever she plays tennis. Included stretch to L knee and trial of ionto patch to target medial knee inflammation.    PT Treatment/Interventions ADLs/Self Care Home Management;Aquatic Therapy;Cryotherapy;Electrical Stimulation;Iontophoresis 4mg /ml Dexamethasone;Moist Heat;Neuromuscular re-education;Balance training;Therapeutic exercise;Therapeutic activities;Functional mobility training;Stair training;Gait training;Patient/family education;Manual techniques;Energy conservation;Dry  needling;Passive range of motion;Taping    PT Next Visit Plan Assess response to HEP and update. Progress LLE stability exercises as tolerated and incorporate LE stretches (HS, hip FL, butterfly stretch, ITB) as indicated.    PT Home Exercise Plan [id number] - hip 4-way (FL, EXT, ABD, ADD) with 3# resistance, active knee FL/EXT with light pressure applied to L medial joint line, application of heat to promote increased blood flow as long as pt doesn't experience inflammation    Consulted and Agree with Plan of Care Patient           Patient will benefit from skilled therapeutic intervention in order to improve the following deficits and impairments:  Decreased activity tolerance,Decreased mobility,Decreased endurance,Difficulty walking,Pain  Visit Diagnosis: Chronic pain of left knee  Stiffness of left knee, not elsewhere classified  Other abnormalities of gait and mobility  Muscle weakness (generalized)     Problem List Patient Active Problem List   Diagnosis Date Noted  . Vitamin D deficiency 05/19/2020  . Left knee pain 05/19/2020  . At risk for activity intolerance 05/19/2020  . Low back pain 09/25/2019  . Nonallopathic lesion of sacral region 09/25/2019  . Nonallopathic lesion of thoracic region 09/25/2019  . Nonallopathic lesion of lumbar region 09/25/2019  . Degenerative tear of medial meniscus, left 08/21/2019  . Cervical dysplasia 06/10/2012    PAA,JENNIFER 06/09/2020, 2:00 PM  [facility] [address] [place], [place], [place] Phone: [phone]   Fax:  [phone]  Name: Betty Price MRN: Erven Colla Date of Birth: 1966/03/27  10/17/1965, PT 06/09/20 2:00 PM Phone: [phone] Fax: [phone]

## 2020-06-09 NOTE — Patient Instructions (Signed)

## 2020-06-14 NOTE — Progress Notes (Signed)
Chief Complaint:   OBESITY Betty Price is here to discuss her progress with her obesity treatment plan along with follow-up of her obesity related diagnoses.   Today's visit was #: 11 Starting weight: 179 lbs Starting date: 10/21/2019 Today's weight: 168 lbs Today's date: 06/09/2020 Total lbs lost to date: 11 lbs Body mass index is 28.84 kg/m.  Total weight loss percentage to date: -6.15%  Interim History: Betty Price says she is doing well with no issues with the plan, but has not been as diligent lately due to a knee injury.  Plan:  Recipes given and meal planning/prep discussed with patient.  Nutrition Plan: Category 1 Plan for 65% of the time. Activity: Tennis / PT for 90 minutes once a week. This patient is following the prescribed meal plan meal without concerns.  Food recall appears to be consistent with the prescribed plan.  When following the plan, hunger and cravings are well controlled.    Assessment/Plan:   Medications Discontinued During This Encounter  Medication Reason  . Vitamin D, Ergocalciferol, (DRISDOL) 1.25 MG (50000 UNIT) CAPS capsule Reorder     Meds ordered this encounter  Medications  . Vitamin D, Ergocalciferol, (DRISDOL) 1.25 MG (50000 UNIT) CAPS capsule    Sig: Take 1 capsule (50,000 Units total) by mouth every 7 (seven) days.    Dispense:  4 capsule    Refill:  0     1. Vitamin D deficiency Improving, but not optimized. Current vitamin D is 47.1, tested on 10/21/2019. Optimal goal > 50 ng/dL.   Plan: Continue to take prescription Vitamin D @50 ,000 IU every week as prescribed.  Follow-up for routine testing of Vitamin D, at least 2-3 times per year to avoid over-replacement.  - Refill Vitamin D, Ergocalciferol, (DRISDOL) 1.25 MG (50000 UNIT) CAPS capsule; Take 1 capsule (50,000 Units total) by mouth every 7 (seven) days.  Dispense: 4 capsule; Refill: 0  2. At risk for activity intolerance Betty Price was given approximately 8 minutes of counseling today  regarding her increased risk for exercise intolerance.  We discussed patient's specific personal and medical issues that raise our concern.  She was advised of strategies to prevent injury and ways to improve her cardiopulmonary fitness levels slowly over time.  We additionally discussed various fitness trackers and smart phone apps to help motivate the patient to stay on track.   3. Class 1 obesity with serious comorbidity and body mass index (BMI) of 30.0 to 30.9 in adult, unspecified obesity type  Course: Betty Price is currently in the action stage of change. As such, her goal is to continue with weight loss efforts.   Nutrition goals: She has agreed to the Category 1 Plan.   Exercise goals: Per PT currently.  Behavioral modification strategies: increasing water intake, meal planning and cooking strategies, keeping healthy foods in the home and planning for success.  Betty Price has agreed to follow-up with our clinic in 4 weeks. She was informed of the importance of frequent follow-up visits to maximize her success with intensive lifestyle modifications for her multiple health conditions.   Objective:   Blood pressure 135/89, pulse 65, temperature 97.9 F (36.6 C), height 5\' 4"  (1.626 m), weight 168 lb (76.2 kg), SpO2 99 %. Body mass index is 28.84 kg/m.  General: Cooperative, alert, well developed, in no acute distress. HEENT: Conjunctivae and lids unremarkable. Cardiovascular: Regular rhythm.  Lungs: Normal work of breathing. Neurologic: No focal deficits.   Lab Results  Component Value Date   CREATININE 0.91  10/21/2019   BUN 18 10/21/2019   NA 140 10/21/2019   K 4.8 10/21/2019   CL 104 10/21/2019   CO2 22 10/21/2019   Lab Results  Component Value Date   ALT 21 10/21/2019   AST 20 10/21/2019   ALKPHOS 92 10/21/2019   BILITOT 0.5 10/21/2019   Lab Results  Component Value Date   HGBA1C 5.6 10/21/2019   HGBA1C 5.4 11/06/2017   HGBA1C 5.1 09/04/2016   HGBA1C 5.4 07/23/2015    HGBA1C 5.6 07/21/2014   Lab Results  Component Value Date   INSULIN 9.7 10/21/2019   Lab Results  Component Value Date   TSH 1.060 10/21/2019   Lab Results  Component Value Date   CHOL 237 (H) 10/21/2019   HDL 76 10/21/2019   LDLCALC 149 (H) 10/21/2019   TRIG 72 10/21/2019   CHOLHDL 3.3 11/06/2017   Lab Results  Component Value Date   WBC 3.7 10/21/2019   HGB 13.4 10/21/2019   HCT 39.0 10/21/2019   MCV 94 10/21/2019   PLT 245 10/21/2019   Attestation Statements:   Reviewed by clinician on day of visit: allergies, medications, problem list, medical history, surgical history, family history, social history, and previous encounter notes.  I, Insurance claims handler, CMA, am acting as Energy manager for [employer], DO.  I have reviewed the above documentation for accuracy and completeness, and I agree with the above. Carlye Grippe, D.O.  The 21st Century Cures Act was signed into law in 2016 which includes the topic of electronic health records.  This provides immediate access to information in MyChart.  This includes consultation notes, operative notes, office notes, lab results and pathology reports.  If you have any questions about what you read please let us know at your next visit so we can discuss your concerns and take corrective action if need be.  We are right here with you.

## 2020-06-17 ENCOUNTER — Ambulatory Visit: Payer: Managed Care, Other (non HMO)

## 2020-06-17 ENCOUNTER — Other Ambulatory Visit: Payer: Self-pay

## 2020-06-17 DIAGNOSIS — M25662 Stiffness of left knee, not elsewhere classified: Secondary | ICD-10-CM

## 2020-06-17 DIAGNOSIS — M6281 Muscle weakness (generalized): Secondary | ICD-10-CM

## 2020-06-17 DIAGNOSIS — M25562 Pain in left knee: Secondary | ICD-10-CM

## 2020-06-17 DIAGNOSIS — G8929 Other chronic pain: Secondary | ICD-10-CM

## 2020-06-17 DIAGNOSIS — R2689 Other abnormalities of gait and mobility: Secondary | ICD-10-CM

## 2020-06-17 NOTE — Therapy (Signed)
[facility] [address] [place], [place], [address] Phone: [phone]   Fax:  [phone]  Physical Therapy Treatment  Patient Details  Name: Betty CollaJill B Price MRN: [id number] Date of Birth: 10/11/1965 Referring Provider (PT): Betty Price, Betty M, DO   Encounter Date: 06/17/2020   PT End of Session - 06/17/20 1529    Visit Number 3    Number of Visits 13    Date for PT Re-Evaluation 07/17/20    Authorization Type [employer] - VASO note covered; FOTO visit 6 and visit 10    PT Start Time 1530    PT Stop Time 1610    PT Time Calculation (min) 40 min    Activity Tolerance Patient tolerated treatment well    Behavior During Therapy [facility] for tasks assessed/performed           Past Medical History:  Diagnosis Date  . Acute lateral meniscus tear of left knee   . Allergy   . Back pain   . Emotional stress   . Food allergy   . Gluten free diet   . Hives 2012   SAW DR. HICKS  . Joint pain     Past Surgical History:  Procedure Laterality Date  . GYNECOLOGIC CRYOSURGERY  05/08/2006   cin 1  . lasix surgery    . MENISCUS REPAIR  10/2009   in 2014 left knee    There were no vitals filed for this visit.   Subjective Assessment - 06/17/20 1533    Subjective Pt reports it is difficult/impossible not to twist while playing tennis on a hard court, so she has decided to take a month off from tennis (until 07/14/2020) and see how she feels. She states she has perform home exercises 3x since last seeing me for initial evaluation due to being busy but plans to be more consistent and is still motivated.    Pertinent History See PMH above    Limitations Walking    How long can you sit comfortably? No limitations    How long can you stand comfortably? No limitations    How long can you walk comfortably? No issues with household ambulation but hasn't tried prolonged walking - used to walk 1.5 hours    Diagnostic tests US 05/24/2020 reveals L knee  medial meniscal tear. Negative radiograph of L knee 05/24/2020.    Patient Stated Goals Return to tennis at full capacity and going for walks; return to regular gym routine    Currently in Pain? Yes    Pain Score 3     Pain Location Knee    Pain Orientation Left    Pain Descriptors / Indicators Sharp   "annoyance when I turn or step a certain way"             [facility] - 06/17/20 0001      Assessment   Medical Diagnosis Chronic pain of left knee (M25.562, G89.29)    Referring Provider (PT) Betty Price, Betty M, DO    Next MD Visit 06/24/2020                         [facility]/Exercise - 06/17/20 0001      Knee/Hip Exercises: Stretches   Passive Hamstring Stretch Left;1 rep;Other (comment)   90 seconds   Hip Flexor Stretch Limitations L Thomas stretch x 90 second    ITB Stretch Left;1 rep;60 seconds      Knee/Hip  Exercises: Aerobic   Stationary Bike L4 x 5 min      Knee/Hip Exercises: Standing   Other Standing Knee Exercises Lateral stepping and monster walks with green theraband proximal to knees 2 x 25 feet each      Knee/Hip Exercises: Seated   Long Arc Quad Strengthening;Left;1 set;20 reps    Long Arc Quad Weight 3 lbs.    Long [employer] Limitations with ball squeeze      Knee/Hip Exercises: Supine   Straight Leg Raises Strengthening;Left;15 reps    Straight Leg Raises Limitations 3# ankle weight    Straight Leg Raise with External Rotation Strengthening;Left;15 reps    Straight Leg Raise with External Rotation Limitations 3 lbs      Knee/Hip Exercises: Sidelying   Hip ABduction Strengthening;Left;15 reps    Hip ABduction Limitations 3# ankle weight    Hip ADduction Strengthening;Left;15 reps    Hip ADduction Limitations 3# ankle weight      Knee/Hip Exercises: Prone   Straight Leg Raises Strengthening;Left;10 reps    Straight Leg Raises Limitations 3# ankle weight      Iontophoresis   Type of Iontophoresis Dexamethasone     Location Rt medial knee    Dose 1 cc    Time 6 hour patch                  PT Education - 06/17/20 1729    Education Details Updated HEP to include monster walks, lateral stepping, and seated LAQ with hip ADD isometric. Ionto patch 6 hours.    Person(s) Educated Patient    Methods Explanation;Demonstration    Comprehension Verbalized understanding;Returned demonstration            PT Short Term Goals - 06/02/20 1429      PT SHORT TERM GOAL #1   Title Patient will be independent with initial HEP.    Baseline Pt provided initial HEP at evaluation 06/02/2020.    Time 3    Period Weeks    Status New    Target Date 06/23/20      PT SHORT TERM GOAL #2   Title Patient will report being able to perform at >/= 50% capacity during tennis practice with </= 4/10 L knee pain.    Baseline Pt has only tried playing tennis once with caution but plans to attend Wednesday practices to ease back into it    Time 3    Period Weeks    Status New    Target Date 06/23/20      PT SHORT TERM GOAL #3   Title Patient will demonstrate L knee FL AROM to 145 degrees without discomfort or pain.    Baseline Minimal discomfort noted at 141 degrees L knee FL    Time 3    Period Weeks    Status New    Target Date 06/23/20      PT SHORT TERM GOAL #4   Title Patient will be able to walk for at least 1 hour with </= 3/10 L knee pain.    Baseline Pt has not attempted prolonged walking at this time - she used to walk at least 1.5 hours regularly    Time 3    Period Weeks    Status New    Target Date 06/23/20             PT Long Term Goals - 06/02/20 1433      PT LONG TERM GOAL #1   Title Patient  will be independent with advanced HEP.    Baseline Pt provided initial HEP at evaluation 06/02/2020.    Time 6    Period Weeks    Status New    Target Date 07/14/20      PT LONG TERM GOAL #2   Title Patient will report being able to perform at >/= 75% capacity during tennis practice with </= 3/10  L knee pain.    Baseline Pt has only tried playing tennis once with caution but plans to attend Wednesday practices to ease back into it    Time 6    Period Weeks    Status New    Target Date 07/14/20      PT LONG TERM GOAL #3   Title Patient will improve LLE MMT to 5/5 grossly.    Baseline See flowsheet    Time 6    Period Weeks    Status New    Target Date 07/14/20      PT LONG TERM GOAL #4   Title Patient's FOTO score will increase from 69% to 81% functional status to demonstrate improved perceived ability.    Baseline 69% functional status; predicted 81% function    Time 6    Period Weeks    Status New    Target Date 07/14/20      PT LONG TERM GOAL #5   Title Patient will report being able to resume to regular walking and gym routine without significant pain or discomfort.    Baseline Pt has not attempted regular walking/gym routine at this time    Time 6    Period Weeks    Status New    Target Date 07/14/20                 Plan - 06/17/20 1530    Clinical Impression Statement Patient had good tolerance with interventions this session and initiation of standing band exercises. Continued ionto patch this session as pt expresses improvement in medial knee inflammation since previous session though she continues to have her pain along L medial joint line.    Personal Factors and Comorbidities Age;Past/Current Experience;Time since onset of injury/illness/exacerbation    Examination-Activity Limitations Locomotion Level;Squat    Examination-Participation Restrictions Community Activity;Other    PT Treatment/Interventions ADLs/Self Care Home Management;Aquatic Therapy;Cryotherapy;Electrical Stimulation;Iontophoresis 4mg /ml Dexamethasone;Moist Heat;Neuromuscular re-education;Balance training;Therapeutic exercise;Therapeutic activities;Functional mobility training;Stair training;Gait training;Patient/family education;Manual techniques;Energy conservation;Dry needling;Passive  range of motion;Taping    PT Next Visit Plan Review STG. Assess response to HEP and update. Progress LLE stability exercises as tolerated and continue LE stretches. Progress standing activities within tolerance (mini squats/SL mini squats, SL balance activities, quarter lunges, STS, mini jumps/hops). Ionto PRN for medial knee inflammation, meniscus squeeze MWM    PT Home Exercise Plan [id number] - hip 4-way (FL, EXT, ABD, ADD) with 3# resistance, active knee FL/EXT with light pressure applied to L medial joint line, monster walks (green), lateral stepping (green), LAQ with hip ADD    Consulted and Agree with Plan of Care Patient           Patient will benefit from skilled therapeutic intervention in order to improve the following deficits and impairments:  Decreased activity tolerance,Decreased mobility,Decreased endurance,Difficulty walking,Pain  Visit Diagnosis: Chronic pain of left knee  Stiffness of left knee, not elsewhere classified  Other abnormalities of gait and mobility  Muscle weakness (generalized)     Problem List Patient Active Problem List   Diagnosis Date Noted  . Vitamin D deficiency 05/19/2020  .  Left knee pain 05/19/2020  . At risk for activity intolerance 05/19/2020  . Low back pain 09/25/2019  . Nonallopathic lesion of sacral region 09/25/2019  . Nonallopathic lesion of thoracic region 09/25/2019  . Nonallopathic lesion of lumbar region 09/25/2019  . Degenerative tear of medial meniscus, left 08/21/2019  . Cervical dysplasia 06/10/2012      Rhea Bleacher, PT, DPT 06/17/20 5:43 PM  [facility] [address] [place], [place], [address] Phone: [phone]   Fax:  [phone]  Name: Betty Price MRN: [id number] Date of Birth: [birth year]

## 2020-06-21 NOTE — Progress Notes (Deleted)
[facility] [address] [place] [address] Phone: [phone] Subjective:    I'm seeing this patient by the request  of:  Lewis Moccasin, MD  CC:   [id number]   05/24/2020 Patient does have some mild exacerbation of this underlying problem.  It does appear to have more of an acute on chronic meniscal tear.  We will get x-rays to further evaluate the amount of arthritic changes.  Discussed icing regimen, topical anti-inflammatories and will start with formal physical therapy.  Worsening symptoms will consider injection, possible advanced imaging.  Follow-up again in 4 to 6 weeks  Update 06/24/2020 Betty Price is a 55 y.o. female coming in with complaint of left knee, meniscus tear.   Xray L Knee 05/24/2020 IMPRESSION: Negative.     Past Medical History:  Diagnosis Date  . Acute lateral meniscus tear of left knee   . Allergy   . Back pain   . Emotional stress   . Food allergy   . Gluten free diet   . Hives 2012   SAW DR. HICKS  . Joint pain    Past Surgical History:  Procedure Laterality Date  . GYNECOLOGIC CRYOSURGERY  05/08/2006   cin 1  . lasix surgery    . MENISCUS REPAIR  10/2009   in 2014 left knee   Social History   Socioeconomic History  . Marital status: Married    Spouse name: Not on file  . Number of children: Not on file  . Years of education: Not on file  . Highest education level: Not on file  Occupational History  . Occupation: Haematologist  Tobacco Use  . Smoking status: Never Smoker  . Smokeless tobacco: Never Used  Vaping Use  . Vaping Use: Never used  Substance and Sexual Activity  . Alcohol use: Yes    Alcohol/week: 0.0 standard drinks    Comment: occassional  . Drug use: No  . Sexual activity: Yes    Comment: INTERCOURSE AGE 74, SEXUAL PARTNERS MORE THAN 5  Other Topics Concern  . Not on file  Social History Narrative  . Not on file   Social Determinants of Health    Financial Resource Strain: Not on file  Food Insecurity: Not on file  Transportation Needs: Not on file  Physical Activity: Not on file  Stress: Not on file  Social Connections: Not on file   No Known Allergies Family History  Problem Relation Age of Onset  . Heart disease Father        died of MI  . Hypertension Father   . Allergic rhinitis Father   . Sudden death Father   . Diabetes Maternal Grandmother   . Allergic rhinitis Mother   . Obesity Mother   . Colon cancer Neg Hx   . Esophageal cancer Neg Hx   . Pancreatic cancer Neg Hx   . Rectal cancer Neg Hx   . Stomach cancer Neg Hx     Current Outpatient Medications (Endocrine & Metabolic):  .  NP THYROID 30 MG tablet, Take 30 mg by mouth every morning. Marland Kitchen  PROGESTERONE PO, 100 mg.      Current Outpatient Medications (Other):  Marland Kitchen  MAGNESIUM GLYCINATE PO, Take 100 mg by mouth daily. Marland Kitchen  OVER THE COUNTER MEDICATION, Magnesium Citrate 100 mg. One tablet daily. .  Vitamin D, Ergocalciferol, (DRISDOL) 1.25 MG (50000 UNIT) CAPS capsule, Take 1 capsule (50,000 Units total) by mouth every 7 (seven)  days.   Reviewed prior external information including notes and imaging from  primary care provider As well as notes that were available from care everywhere and other healthcare systems.  Past medical history, social, surgical and family history all reviewed in electronic medical record.  No pertanent information unless stated regarding to the chief complaint.   Review of Systems:  No headache, visual changes, nausea, vomiting, diarrhea, constipation, dizziness, abdominal pain, skin rash, fevers, chills, night sweats, weight loss, swollen lymph nodes, body aches, joint swelling, chest pain, shortness of breath, mood changes. POSITIVE muscle aches  Objective  There were no vitals taken for this visit.   General: No apparent distress alert and oriented x3 mood and affect normal, dressed appropriately.  HEENT: Pupils equal,  extraocular movements intact  Respiratory: Patient's speak in full sentences and does not appear short of breath  Cardiovascular: No lower extremity edema, non tender, no erythema  Gait normal with good balance and coordination.  MSK:  Non tender with full range of motion and good stability and symmetric strength and tone of shoulders, elbows, wrist, hip, knee and ankles bilaterally.     Impression and Recommendations:     The above documentation has been reviewed and is accurate and complete Wilford Grist

## 2020-06-23 ENCOUNTER — Ambulatory Visit: Payer: Managed Care, Other (non HMO) | Admitting: Physical Therapy

## 2020-06-24 ENCOUNTER — Ambulatory Visit: Payer: Managed Care, Other (non HMO) | Admitting: Family Medicine

## 2020-06-25 ENCOUNTER — Other Ambulatory Visit: Payer: Self-pay

## 2020-06-25 ENCOUNTER — Encounter: Payer: Self-pay | Admitting: Physical Therapy

## 2020-06-25 ENCOUNTER — Ambulatory Visit: Payer: Managed Care, Other (non HMO) | Admitting: Physical Therapy

## 2020-06-25 DIAGNOSIS — R2689 Other abnormalities of gait and mobility: Secondary | ICD-10-CM

## 2020-06-25 DIAGNOSIS — M25562 Pain in left knee: Secondary | ICD-10-CM

## 2020-06-25 DIAGNOSIS — M6281 Muscle weakness (generalized): Secondary | ICD-10-CM

## 2020-06-25 DIAGNOSIS — M25662 Stiffness of left knee, not elsewhere classified: Secondary | ICD-10-CM

## 2020-06-25 DIAGNOSIS — G8929 Other chronic pain: Secondary | ICD-10-CM

## 2020-06-25 NOTE — Patient Instructions (Signed)
Access Code: [email or web address] URL: [email or web address] Date: 06/25/2020 Prepared by: Rosana Hoes  Exercises Sidelying Hip Abduction - 1-2 x daily - 7 x weekly - 2 sets - 15 reps Prone Hip Extension - 1-2 x daily - 7 x weekly - 2 sets - 15 reps Sidelying Hip Adduction - 1-2 x daily - 7 x weekly - 2 sets - 15 reps Supine Active Straight Leg Raise - 1-2 x daily - 7 x weekly - 2 sets - 15 reps Supine Hamstring Stretch with Strap - 1 x daily - 7 x weekly - 2 sets - 3-5 reps - 30-60 hold Supine ITB Stretch with Strap - 1 x daily - 7 x weekly - 2 sets - 3-5 reps - 30-60 hold Hip Adductors and Hamstring Stretch with Strap - 1 x daily - 7 x weekly - 1 sets - 3-5 reps - 30-60 hold Kneeling Adductor Stretch with Hip External Rotation - 1 x daily - 7 x weekly - 2 sets - 3-5 reps - 30-60 hold Squat at Table - 1 x daily - 7 x weekly - 3 sets - 10 reps Side Stepping with Resistance at Thighs - 1 x daily - 7 x weekly - 3 sets - 10 reps Band Walks - 1 x daily - 7 x weekly - 3 sets - 10 reps Standing 3-Way Kick - 1 x daily - 7 x weekly - 2 sets - 10 reps

## 2020-06-25 NOTE — Therapy (Addendum)
[facility] [address] [place], [place], [address] Phone: [phone]   Fax:  [phone]  Physical Therapy Treatment  Patient Details  Name: Betty Price MRN: [id number] Date of Birth: 25-Oct-1965 Referring Provider (PT): Judi Saa, DO   Encounter Date: 06/25/2020   PT End of Session - 06/25/20 1005     Visit Number 4    Number of Visits 13    Date for PT Re-Evaluation 07/17/20    Authorization Type [employer] - VASO note covered; FOTO visit 6 and visit 10    PT Start Time 1007    PT Stop Time 1046    PT Time Calculation (min) 39 min    Activity Tolerance Patient tolerated treatment well    Behavior During Therapy [facility] for tasks assessed/performed             Past Medical History:  Diagnosis Date   Acute lateral meniscus tear of left knee    Allergy    Back pain    Emotional stress    Food allergy    Gluten free diet    Hives 2012   SAW DR. HICKS   Joint pain     Past Surgical History:  Procedure Laterality Date   GYNECOLOGIC CRYOSURGERY  05/08/2006   cin 1   lasix surgery     MENISCUS REPAIR  10/2009   in 2014 left knee    There were no vitals filed for this visit.   Subjective Assessment - 06/25/20 1007     Subjective Feel like the knee might be slightly better. Have been able to do the exercises more, the only one that hurts is the hip adduction one, but only 3/10 pain. If i have been sitting or laying for awhile, the area of the tear will hurt.    Currently in Pain? Yes    Pain Score 0-No pain   5/10 this morning when gettting out of bed   Pain Location Knee    Pain Orientation Left    Pain Descriptors / Indicators Sharp    Pain Type Acute pain    Pain Onset More than a month ago    Pain Frequency Intermittent                                               [facility] Adult PT Treatment/Exercise - 06/25/20 0001       Knee/Hip Exercises: Stretches   Passive Hamstring Stretch  Left;1 rep;Other (comment)      Knee/Hip Exercises: Aerobic   Stationary Bike L2 x 5 min      Knee/Hip Exercises: Standing   SLS x30 seconds    Other Standing Knee Exercises Lateral stepping and monster walks fwd and bwd with green theraband proximal to knees 2 x 25 feet each      Knee/Hip Exercises: Supine   Straight Leg Raises Strengthening;Left;15 reps    Straight Leg Raises Limitations 4 #   Straight Leg Raise with External Rotation Strengthening;Left;15 reps    Straight Leg Raise with External Rotation Limitations 4 #     Knee/Hip Exercises: Sidelying   Hip ABduction Strengthening;Left;15 reps    Hip ABduction Limitations 3# ankle weight    Hip ADduction Strengthening;Left;15 reps    Hip ADduction Limitations 3# ankle weight      Knee/Hip Exercises: Prone   Straight  Leg Raises 15 reps;Strengthening;Left    Straight Leg Raises Limitations 3# ankle weight                          PT Education - 06/25/20 1004     Education Details HEP update    Person(s) Educated Patient    Methods Explanation;Handout    Comprehension Verbalized understanding;Need further instruction              PT Short Term Goals - 06/25/20 1015       PT SHORT TERM GOAL #1   Title Patient will be independent with initial HEP.    Baseline Pt provided initial HEP at evaluation 06/02/2020.    Status Achieved      PT SHORT TERM GOAL #2   Title Patient will report being able to perform at >/= 50% capacity during tennis practice with </= 4/10 L knee pain.    Baseline Pt taking a month off of tennis    Time 3    Period Weeks    Status On-going    Target Date 06/23/20      PT SHORT TERM GOAL #3   Title Patient will demonstrate L knee FL AROM to 145 degrees without discomfort or pain.    Baseline 140 but no discomfrot noted    Time 3    Period Weeks    Status On-going    Target Date 06/23/20      PT SHORT TERM GOAL #4   Title Patient will be able to walk for at least 1 hour with  </= 3/10 L knee pain.    Baseline only walking 20 minutes with no pain    Time 3    Period Weeks    Status On-going    Target Date 06/23/20                PT Long Term Goals - 06/02/20 1433       PT LONG TERM GOAL #1   Title Patient will be independent with advanced HEP.    Baseline Pt provided initial HEP at evaluation 06/02/2020.    Time 6    Period Weeks    Status New    Target Date 07/14/20      PT LONG TERM GOAL #2   Title Patient will report being able to perform at >/= 75% capacity during tennis practice with </= 3/10 L knee pain.    Baseline Pt has only tried playing tennis once with caution but plans to attend Wednesday practices to ease back into it    Time 6    Period Weeks    Status New    Target Date 07/14/20      PT LONG TERM GOAL #3   Title Patient will improve LLE MMT to 5/5 grossly.    Baseline See flowsheet    Time 6    Period Weeks    Status New    Target Date 07/14/20      PT LONG TERM GOAL #4   Title Patient's FOTO score will increase from 69% to 81% functional status to demonstrate improved perceived ability.    Baseline 69% functional status; predicted 81% function    Time 6    Period Weeks    Status New    Target Date 07/14/20      PT LONG TERM GOAL #5   Title Patient will report being able to resume to regular walking and gym routine  without significant pain or discomfort.    Baseline Pt has not attempted regular walking/gym routine at this time    Time 6    Period Weeks    Status New    Target Date 07/14/20                        Plan - 06/25/20 1046     Clinical Impression Statement Pt tolerated exercise well with no adverse effects. Session focused more on weight bearing standing exercises with CKC knee flexion with good tolerance. Short term goals are ongoing because pt has stopped tennis for now and has decreased her walking. Pt educated on starting graded walking program to increase weight bearing tolerance and help  with return to tennis. Pt still noticed pain with sidelying hip adduction exercises this session. Pt continues to benefit from skilled PT in order to increase LE functional strength, decrase pain, and increaes knee ROM in order to return to tennis and increase ability to walk without pain.    PT Treatment/Interventions ADLs/Self Care Home Management;Aquatic Therapy;Cryotherapy;Electrical Stimulation;Iontophoresis 4mg /ml Dexamethasone;Moist Heat;Neuromuscular re-education;Balance training;Therapeutic exercise;Therapeutic activities;Functional mobility training;Stair training;Gait training;Patient/family education;Manual techniques;Energy conservation;Dry needling;Passive range of motion;Taping    PT Next Visit Plan move weight proximal to knee with hip adduction if still aggravating, more weight bearing CKC knee flexion exercises, meniscus squeeze MWM    PT Home Exercise Plan [facility]             Patient will benefit from skilled therapeutic intervention in order to improve the following deficits and impairments:  Decreased activity tolerance,Decreased mobility,Decreased endurance,Difficulty walking,Pain  Visit Diagnosis: No diagnosis found.      Problem List Patient Active Problem List   Diagnosis Date Noted   Vitamin D deficiency 05/19/2020   Left knee pain 05/19/2020   At risk for activity intolerance 05/19/2020   Low back pain 09/25/2019   Nonallopathic lesion of sacral region 09/25/2019   Nonallopathic lesion of thoracic region 09/25/2019   Nonallopathic lesion of lumbar region 09/25/2019   Degenerative tear of medial meniscus, left 08/21/2019   Cervical dysplasia 06/10/2012    08/08/2012, SPT 06/25/2020, 11:39 AM  [facility] [address] [place], [place], [place] Phone: [phone]   Fax:  [phone]  Name: CARIE KAPUSCINSKI MRN: Erven Colla Date of Birth: May 29, 1965

## 2020-06-28 ENCOUNTER — Ambulatory Visit: Payer: Managed Care, Other (non HMO)

## 2020-06-28 ENCOUNTER — Other Ambulatory Visit: Payer: Self-pay

## 2020-06-28 DIAGNOSIS — M25562 Pain in left knee: Secondary | ICD-10-CM

## 2020-06-28 DIAGNOSIS — R2689 Other abnormalities of gait and mobility: Secondary | ICD-10-CM

## 2020-06-28 DIAGNOSIS — M25662 Stiffness of left knee, not elsewhere classified: Secondary | ICD-10-CM

## 2020-06-28 DIAGNOSIS — G8929 Other chronic pain: Secondary | ICD-10-CM

## 2020-06-28 DIAGNOSIS — M6281 Muscle weakness (generalized): Secondary | ICD-10-CM

## 2020-06-28 NOTE — Therapy (Signed)
[facility] [address] [place], [place], [address] Phone: [phone]   Fax:  [phone]  Physical Therapy Treatment  Patient Details  Name: Betty Price MRN: [id number] Date of Birth: 06-29-1965 Referring Provider (PT): Judi SaaSmith, Zachary M, DO   Encounter Date: 06/28/2020   PT End of Session - 06/28/20 1402    Visit Number 5    Number of Visits 13    Date for PT Re-Evaluation 07/17/20    Authorization Type [employer] - VASO note covered; FOTO visit 6 and visit 10    PT Start Time 1402    PT Stop Time 1443    PT Time Calculation (min) 41 min    Activity Tolerance Patient tolerated treatment well    Behavior [facility] for tasks assessed/performed           Past Medical History:  Diagnosis Date  . Acute lateral meniscus tear of left knee   . Allergy   . Back pain   . Emotional stress   . Food allergy   . Gluten free diet   . Hives 2012   SAW DR. HICKS  . Joint pain     Past Surgical History:  Procedure Laterality Date  . GYNECOLOGIC CRYOSURGERY  05/08/2006   cin 1  . lasix surgery    . MENISCUS REPAIR  10/2009   in 2014 left knee    There were no vitals filed for this visit.   Subjective Assessment - 06/28/20 1401    Subjective "I might not play tennis for another 2-3 weeks depending on how it's doing by then. The only one that really bothers me sometimes is the hip adduction." Pt was very sore after going for a 30 minute walk immediately after her last PT session.    Pertinent History See PMH above    Limitations Walking    How long can you sit comfortably? No limitations    How long can you stand comfortably? No limitations    How long can you walk comfortably? No issues with household ambulation but hasn't tried prolonged walking - used to walk 1.5 hours    Diagnostic tests US 05/24/2020 reveals L knee medial meniscal tear. Negative radiograph of L knee 05/24/2020.    Patient Stated Goals Return to tennis  at full capacity and going for walks; return to regular gym routine    Currently in Pain? Yes    Pain Score 2     Pain Location Knee    Pain Orientation Left    Pain Descriptors / Indicators Sharp    Pain Type Acute pain    Pain Onset More than a month ago              [facility] - 06/28/20 0001      Assessment   Medical Diagnosis Chronic pain of left knee (M25.562, G89.29)    Referring Provider (PT) Judi SaaSmith, Zachary M, DO    Next MD Visit 07/19/2020                         [facility]/Exercise - 06/28/20 0001      Knee/Hip Exercises: Aerobic   Nustep L6 x 5 min      Knee/Hip Exercises: Standing   Heel Raises Both;2 sets;20 reps    Heel Raises Limitations 1st set with knees extended; 2nd set with knees slightly flexed - cues to avoid excessive knee FL  Functional Squat 20 reps    Functional Squat Limitations mini squats    SLS 10x SLS on airex with EC    Rebounder 15 throws with red medicine ball - L SLS on airex    Other Standing Knee Exercises Quick steps backwards with cues to avoid excessive knee flexion 4 x 20 feet    Other Standing Knee Exercises AP and ML quick movements as if moving lightly around tennis court < 25% capacity with cones set in all 4 directions and PT calling out random directions for patient to move in then "swing" theralog as a tennis racket      Knee/Hip Exercises: Supine   Straight Leg Raises Strengthening;Left;2 sets;15 reps    Straight Leg Raises Limitations 4    Straight Leg Raise with External Rotation Strengthening;Left;2 sets;15 reps    Straight Leg Raise with External Rotation Limitations 4      Knee/Hip Exercises: Sidelying   Hip ABduction Strengthening;Left;2 sets;15 reps    Hip ABduction Limitations 4# ankle weight    Hip ADduction Strengthening;Left;2 sets;15 reps    Hip ADduction Limitations 4# weight proximal to L knee      Knee/Hip Exercises: Prone   Straight Leg Raises Strengthening;Left;2  sets;15 reps    Straight Leg Raises Limitations 4# ankle weight      Iontophoresis   Type of Iontophoresis Dexamethasone    Location Rt medial knee    Dose 1 cc    Time 6 hour patch      Manual Therapy   Manual therapy comments Meniscal squeeze MWM - Pressure along L knee medial joint line as patient performs AROM. Then pt performed independently                    PT Short Term Goals - 06/25/20 1015      PT SHORT TERM GOAL #1   Title Patient will be independent with initial HEP.    Baseline Pt provided initial HEP at evaluation 06/02/2020.    Status Achieved      PT SHORT TERM GOAL #2   Title Patient will report being able to perform at >/= 50% capacity during tennis practice with </= 4/10 L knee pain.    Baseline Pt taking a month off of tennis    Time 3    Period Weeks    Status On-going    Target Date 06/23/20      PT SHORT TERM GOAL #3   Title Patient will demonstrate L knee FL AROM to 145 degrees without discomfort or pain.    Baseline 140 but no discomfrot noted    Time 3    Period Weeks    Status On-going    Target Date 06/23/20      PT SHORT TERM GOAL #4   Title Patient will be able to walk for at least 1 hour with </= 3/10 L knee pain.    Baseline only walking 20 minutes with no pain    Time 3    Period Weeks    Status On-going    Target Date 06/23/20             PT Long Term Goals - 06/02/20 1433      PT LONG TERM GOAL #1   Title Patient will be independent with advanced HEP.    Baseline Pt provided initial HEP at evaluation 06/02/2020.    Time 6    Period Weeks    Status New  Target Date 07/14/20      PT LONG TERM GOAL #2   Title Patient will report being able to perform at >/= 75% capacity during tennis practice with </= 3/10 L knee pain.    Baseline Pt has only tried playing tennis once with caution but plans to attend Wednesday practices to ease back into it    Time 6    Period Weeks    Status New    Target Date 07/14/20       PT LONG TERM GOAL #3   Title Patient will improve LLE MMT to 5/5 grossly.    Baseline See flowsheet    Time 6    Period Weeks    Status New    Target Date 07/14/20      PT LONG TERM GOAL #4   Title Patient's FOTO score will increase from 69% to 81% functional status to demonstrate improved perceived ability.    Baseline 69% functional status; predicted 81% function    Time 6    Period Weeks    Status New    Target Date 07/14/20      PT LONG TERM GOAL #5   Title Patient will report being able to resume to regular walking and gym routine without significant pain or discomfort.    Baseline Pt has not attempted regular walking/gym routine at this time    Time 6    Period Weeks    Status New    Target Date 07/14/20                 Plan - 06/28/20 1402    Clinical Impression Statement Patient tolerated session well with no adverse effects or complaints of increased pain greater than level reported when she arrived. Pt was able to perform AP and ML movement drills to replicate how she would move on a tennis court completed at a slower pace. She did well with 4# weight for hip 4-way exercises with weight moved proximal to L knee during resisted hip adduction this session due to continued discomfort. She should continue to benefit from skilled PT intervention to further increase LE functional strength and progress towards return to tennis and daily activities without limitation and pain.    Personal Factors and Comorbidities Age;Past/Current Experience;Time since onset of injury/illness/exacerbation    Examination-Activity Limitations Locomotion Level;Squat    PT Treatment/Interventions ADLs/Self Care Home Management;Aquatic Therapy;Cryotherapy;Electrical Stimulation;Iontophoresis 4mg /ml Dexamethasone;Moist Heat;Neuromuscular re-education;Balance training;Therapeutic exercise;Therapeutic activities;Functional mobility training;Stair training;Gait training;Patient/family education;Manual  techniques;Energy conservation;Dry needling;Passive range of motion;Taping    PT Next Visit Plan FOTO. Progress weight bearing CKC knee flexion exercises, meniscus squeeze MWM    PT Home Exercise Plan [email or web address]    Consulted and Agree with Plan of Care Patient           Patient will benefit from skilled therapeutic intervention in order to improve the following deficits and impairments:  Decreased activity tolerance,Decreased mobility,Decreased endurance,Difficulty walking,Pain  Visit Diagnosis: Chronic pain of left knee  Stiffness of left knee, not elsewhere classified  Other abnormalities of gait and mobility  Muscle weakness (generalized)     Problem List Patient Active Problem List   Diagnosis Date Noted  . Vitamin D deficiency 05/19/2020  . Left knee pain 05/19/2020  . At risk for activity intolerance 05/19/2020  . Low back pain 09/25/2019  . Nonallopathic lesion of sacral region 09/25/2019  . Nonallopathic lesion of thoracic region 09/25/2019  . Nonallopathic lesion of lumbar region 09/25/2019  . Degenerative tear of medial  meniscus, left 08/21/2019  . Cervical dysplasia 06/10/2012      Rhea Bleacher, PT, DPT 06/28/20 8:14 PM  [facility] [address] [place], [place], [address] Phone: [phone]   Fax:  [phone]  Name: Betty Price MRN: [id number] Date of Birth: 02/24/1966

## 2020-06-29 ENCOUNTER — Ambulatory Visit: Payer: Managed Care, Other (non HMO) | Admitting: Family Medicine

## 2020-06-30 ENCOUNTER — Ambulatory Visit: Payer: Managed Care, Other (non HMO) | Attending: Family Medicine

## 2020-06-30 ENCOUNTER — Other Ambulatory Visit: Payer: Self-pay

## 2020-06-30 DIAGNOSIS — M25662 Stiffness of left knee, not elsewhere classified: Secondary | ICD-10-CM | POA: Insufficient documentation

## 2020-06-30 DIAGNOSIS — M6281 Muscle weakness (generalized): Secondary | ICD-10-CM | POA: Diagnosis present

## 2020-06-30 DIAGNOSIS — M25562 Pain in left knee: Secondary | ICD-10-CM | POA: Diagnosis not present

## 2020-06-30 DIAGNOSIS — R2689 Other abnormalities of gait and mobility: Secondary | ICD-10-CM | POA: Insufficient documentation

## 2020-06-30 DIAGNOSIS — G8929 Other chronic pain: Secondary | ICD-10-CM | POA: Diagnosis present

## 2020-06-30 NOTE — Therapy (Signed)
[facility] [address] [place], [place], [address] Phone: [phone]   Fax:  [phone]  Physical Therapy Treatment  Patient Details  Name: Betty CollaJill B Perkins MRN: [id number] Date of Birth: [birth year] Referring Provider (PT): Judi SaaSmith, Zachary M, DO   Encounter Date: 06/30/2020   PT End of Session - 06/30/20 1448    Visit Number 6    Number of Visits 13    Date for PT Re-Evaluation 07/17/20    Authorization Type [employer] - VASO note covered; FOTO visit 10    PT Start Time 1450   pt arrived late   PT Stop Time 1530    PT Time Calculation (min) 40 min    Activity Tolerance Patient tolerated treatment well    Behavior During Therapy [facility] for tasks assessed/performed           Past Medical History:  Diagnosis Date  . Acute lateral meniscus tear of left knee   . Allergy   . Back pain   . Emotional stress   . Food allergy   . Gluten free diet   . Hives 2012   SAW DR. HICKS  . Joint pain     Past Surgical History:  Procedure Laterality Date  . GYNECOLOGIC CRYOSURGERY  05/08/2006   cin 1  . lasix surgery    . MENISCUS REPAIR  10/2009   in 2014 left knee    There were no vitals filed for this visit.   Subjective Assessment - 06/30/20 1448    Subjective Pt reports increased soreness and minimal increase in L knee pain after previous session. She went for a 30 minute walk yesterday afternoon during which she only had 1-2/10 L knee pain during the walk that increased to 4/10 afterwards to where she had difficulty walking later in the day. Pt denies pain upon arrival but states that she does tend to have increased pain with increase in activity overall.    Pertinent History See PMH above    Limitations Walking    How long can you sit comfortably? No limitations    How long can you stand comfortably? No limitations    How long can you walk comfortably? No issues with household ambulation but hasn't tried prolonged walking - used to  walk 1.5 hours    Diagnostic tests US 05/24/2020 reveals L knee medial meniscal tear. Negative radiograph of L knee 05/24/2020.    Patient Stated Goals Return to tennis at full capacity and going for walks; return to regular gym routine    Currently in Pain? No/denies    Pain Score 0-No pain    Pain Onset More than a month ago              [facility] - 06/30/20 0001      Assessment   Medical Diagnosis Chronic pain of left knee (M25.562, G89.29)    Referring Provider (PT) Judi SaaSmith, Zachary M, DO    Next MD Visit 07/19/2020      Observation/Other Assessments   Focus on Therapeutic Outcomes (FOTO)  75% function; predicted 81% function                         [facility] Adult PT Treatment/Exercise - 06/30/20 0001      Self-Care   Self-Care Other Self-Care Comments    Other Self-Care Comments  See patient education      Knee/Hip Exercises: Aerobic   Stationary Bike L3 x 5  min      Knee/Hip Exercises: Standing   Heel Raises Both;1 set;20 reps    Heel Raises Limitations with slight knee flexion    Functional Squat 20 reps    Functional Squat Limitations mini squats; experienced 1-2/10 pain in L knee    SLS 5 x 15-20 sec on BOSU (blue)    Rebounder 2 x 10 throws with red medicine ball - L SLS on airex    Other Standing Knee Exercises L SL mini squat x 15    Other Standing Knee Exercises AP/ML/diagonal quick movements as if moving at light to moderate pace around tennis court with cones set in all 4 directions and PT calling out random directions for patient to move in (then pt independently moving in various directions) while holding theralog as a tennis racket      Knee/Hip Exercises: Supine   Bridges with Beacher May Strengthening;Both;2 sets;15 reps    Straight Leg Raises Strengthening;Left;2 sets;15 reps    Straight Leg Raises Limitations 4    Straight Leg Raise with External Rotation Strengthening;Left;2 sets;15 reps    Straight Leg Raise with External  Rotation Limitations 4      Knee/Hip Exercises: Sidelying   Hip ABduction Strengthening;Left;2 sets;15 reps    Hip ABduction Limitations 4# ankle weight      Knee/Hip Exercises: Prone   Straight Leg Raises Strengthening;Left;2 sets;15 reps    Straight Leg Raises Limitations 4# ankle weight      Manual Therapy   Manual therapy comments --                  PT Education - 06/30/20 1543    Education Details Reviewed HEP. Advised patient to continue performing exercise and walking within tolerance but avoid activities that cause high levels of pain or irritability. Pt has pain relief with heat application - advised to use heat PRN but cold pack if inflammation is present. Used skeleton model and images to provide visual demonstration/anatomy of medial meniscus and possible types of tears. Reviewed FOTO intake and progress thus far.    Person(s) Educated Patient    Methods Explanation;Demonstration;Verbal cues    Comprehension Verbalized understanding;Returned demonstration            PT Short Term Goals - 06/25/20 1015      PT SHORT TERM GOAL #1   Title Patient will be independent with initial HEP.    Baseline Pt provided initial HEP at evaluation 06/02/2020.    Status Achieved      PT SHORT TERM GOAL #2   Title Patient will report being able to perform at >/= 50% capacity during tennis practice with </= 4/10 L knee pain.    Baseline Pt taking a month off of tennis    Time 3    Period Weeks    Status On-going    Target Date 06/23/20      PT SHORT TERM GOAL #3   Title Patient will demonstrate L knee FL AROM to 145 degrees without discomfort or pain.    Baseline 140 but no discomfrot noted    Time 3    Period Weeks    Status On-going    Target Date 06/23/20      PT SHORT TERM GOAL #4   Title Patient will be able to walk for at least 1 hour with </= 3/10 L knee pain.    Baseline only walking 20 minutes with no pain    Time 3  Period Weeks    Status On-going     Target Date 06/23/20             PT Long Term Goals - 06/02/20 1433      PT LONG TERM GOAL #1   Title Patient will be independent with advanced HEP.    Baseline Pt provided initial HEP at evaluation 06/02/2020.    Time 6    Period Weeks    Status New    Target Date 07/14/20      PT LONG TERM GOAL #2   Title Patient will report being able to perform at >/= 75% capacity during tennis practice with </= 3/10 L knee pain.    Baseline Pt has only tried playing tennis once with caution but plans to attend Wednesday practices to ease back into it    Time 6    Period Weeks    Status New    Target Date 07/14/20      PT LONG TERM GOAL #3   Title Patient will improve LLE MMT to 5/5 grossly.    Baseline See flowsheet    Time 6    Period Weeks    Status New    Target Date 07/14/20      PT LONG TERM GOAL #4   Title Patient's FOTO score will increase from 69% to 81% functional status to demonstrate improved perceived ability.    Baseline 69% functional status; predicted 81% function    Time 6    Period Weeks    Status New    Target Date 07/14/20      PT LONG TERM GOAL #5   Title Patient will report being able to resume to regular walking and gym routine without significant pain or discomfort.    Baseline Pt has not attempted regular walking/gym routine at this time    Time 6    Period Weeks    Status New    Target Date 07/14/20                 Plan - 06/30/20 1547    Clinical Impression Statement Patient tolerated session well with no adverse effects or significant increase in pain. She expressed 1-2/10 pain during double limb mini squats that did not linger during L single limb mini squats or other activities. She did not have pain while moving backwards this session and was able to demonstrate smoother movements in various directions compared to previous session. Hip ADD with weight was substituted for isometric hip ADD (ball squeeze) with bridge this session with good  tolerance. At beginning of session, patient expresses concerns about healing potential and causing further damage due to pain she feels after increased activity, but she felt encouraged when she was able to perform activities with greater ease this session. FOTO score increased from 69% to 75% functional status from 06/02/2020 to today. She hopes to continue strengthening and improving mobility for return to tennis. Pt will continue to benefit from skilled PT to progress towards her functional goals.    Personal Factors and Comorbidities Age;Past/Current Experience;Time since onset of injury/illness/exacerbation    Examination-Activity Limitations Locomotion Level;Squat    PT Treatment/Interventions ADLs/Self Care Home Management;Aquatic Therapy;Cryotherapy;Electrical Stimulation;Iontophoresis 4mg /ml Dexamethasone;Moist Heat;Neuromuscular re-education;Balance training;Therapeutic exercise;Therapeutic activities;Functional mobility training;Stair training;Gait training;Patient/family education;Manual techniques;Energy conservation;Dry needling;Passive range of motion;Taping    PT Next Visit Plan Further progress weight bearing CKC knee flexion exercises, meniscus squeeze MWM. Continue cone drills. Consider agility ladder    PT Home Exercise Plan [facility]  Consulted and Agree with Plan of Care Patient           Patient will benefit from skilled therapeutic intervention in order to improve the following deficits and impairments:  Decreased activity tolerance,Decreased mobility,Decreased endurance,Difficulty walking,Pain  Visit Diagnosis: Chronic pain of left knee  Stiffness of left knee, not elsewhere classified  Other abnormalities of gait and mobility  Muscle weakness (generalized)     Problem List Patient Active Problem List   Diagnosis Date Noted  . Vitamin D deficiency 05/19/2020  . Left knee pain 05/19/2020  . At risk for activity intolerance 05/19/2020  . Low back pain 09/25/2019   . Nonallopathic lesion of sacral region 09/25/2019  . Nonallopathic lesion of thoracic region 09/25/2019  . Nonallopathic lesion of lumbar region 09/25/2019  . Degenerative tear of medial meniscus, left 08/21/2019  . Cervical dysplasia 06/10/2012      Rhea Bleacher, PT, DPT 06/30/20 3:54 PM   [facility] [address] [place], [place], [address] Phone: [phone]   Fax:  [phone]  Name: GAVRIELA CASHIN MRN: [id number] Date of Birth: 19-Sep-1965

## 2020-07-01 ENCOUNTER — Other Ambulatory Visit: Payer: Self-pay

## 2020-07-01 ENCOUNTER — Encounter (INDEPENDENT_AMBULATORY_CARE_PROVIDER_SITE_OTHER): Payer: Self-pay | Admitting: Family Medicine

## 2020-07-01 ENCOUNTER — Ambulatory Visit (INDEPENDENT_AMBULATORY_CARE_PROVIDER_SITE_OTHER): Payer: Managed Care, Other (non HMO) | Admitting: Family Medicine

## 2020-07-01 VITALS — BP 122/74 | HR 60 | Temp 97.6°F | Ht 64.0 in | Wt 166.0 lb

## 2020-07-01 DIAGNOSIS — E7849 Other hyperlipidemia: Secondary | ICD-10-CM

## 2020-07-01 DIAGNOSIS — Z6830 Body mass index (BMI) 30.0-30.9, adult: Secondary | ICD-10-CM

## 2020-07-01 DIAGNOSIS — Z9189 Other specified personal risk factors, not elsewhere classified: Secondary | ICD-10-CM | POA: Diagnosis not present

## 2020-07-01 DIAGNOSIS — E8881 Metabolic syndrome: Secondary | ICD-10-CM

## 2020-07-01 DIAGNOSIS — E669 Obesity, unspecified: Secondary | ICD-10-CM | POA: Diagnosis not present

## 2020-07-01 DIAGNOSIS — E559 Vitamin D deficiency, unspecified: Secondary | ICD-10-CM

## 2020-07-01 MED ORDER — VITAMIN D (ERGOCALCIFEROL) 1.25 MG (50000 UNIT) PO CAPS: 50000.0000 [IU] | ORAL_CAPSULE | ORAL | 0 refills | Status: DC

## 2020-07-02 LAB — LIPID PANEL
Chol/HDL Ratio: 2.7 ratio (ref 0.0–4.4)
Cholesterol, Total: 241 mg/dL — ABNORMAL HIGH (ref 100–199)
HDL: 88 mg/dL (ref >39)
LDL Chol Calc (NIH): 142 mg/dL — ABNORMAL HIGH (ref 0–99)
Triglycerides: 68 mg/dL (ref 0–149)
VLDL Cholesterol Cal: 11 mg/dL (ref 5–40)

## 2020-07-02 LAB — VITAMIN D 25 HYDROXY (VIT D DEFICIENCY, FRACTURES): Vit D, 25-Hydroxy: 44.7 ng/mL (ref 30.0–100.0)

## 2020-07-02 LAB — INSULIN, RANDOM: INSULIN: 7.2 u[IU]/mL (ref 2.6–24.9)

## 2020-07-02 LAB — HEMOGLOBIN A1C
Est. average glucose Bld gHb Est-mCnc: 114 mg/dL
Hgb A1c MFr Bld: 5.6 % (ref 4.8–5.6)

## 2020-07-05 ENCOUNTER — Other Ambulatory Visit: Payer: Self-pay

## 2020-07-05 ENCOUNTER — Ambulatory Visit: Payer: Managed Care, Other (non HMO)

## 2020-07-05 DIAGNOSIS — M25562 Pain in left knee: Secondary | ICD-10-CM

## 2020-07-05 DIAGNOSIS — M6281 Muscle weakness (generalized): Secondary | ICD-10-CM

## 2020-07-05 DIAGNOSIS — G8929 Other chronic pain: Secondary | ICD-10-CM

## 2020-07-05 DIAGNOSIS — R2689 Other abnormalities of gait and mobility: Secondary | ICD-10-CM

## 2020-07-05 DIAGNOSIS — M25662 Stiffness of left knee, not elsewhere classified: Secondary | ICD-10-CM

## 2020-07-05 NOTE — Therapy (Addendum)
[facility] [address] [place], [place], [address] Phone: [phone]   Fax:  [phone]  Physical Therapy Treatment  Patient Details  Name: Betty Price MRN: [id number] Date of Birth: [birth year] Referring Provider (PT): Judi Saa, DO   Encounter Date: 07/05/2020   PT End of Session - 07/05/20 1449    Visit Number 7    Number of Visits 13    Date for PT Re-Evaluation 07/17/20    Authorization Type [employer]ign[employer] - VASO note covered; FOTO visit 10    PT Start Time 1445    PT Stop Time 1523    PT Time Calculation (min) 38 min    Activity Tolerance Patient tolerated treatment well    Behavior During Therapy [facility] for tasks assessed/performed           Past Medical History:  Diagnosis Date  . Acute lateral meniscus tear of left knee   . Allergy   . Back pain   . Emotional stress   . Food allergy   . Gluten free diet   . Hives 2012   SAW DR. HICKS  . Joint pain     Past Surgical History:  Procedure Laterality Date  . GYNECOLOGIC CRYOSURGERY  05/08/2006   cin 1  . lasix surgery    . MENISCUS REPAIR  10/2009   in 2014 left knee    There were no vitals filed for this visit.   Subjective Assessment - 07/05/20 1447    Subjective Pt reports feeling much better since previous session. "I walked about 30 minutes Saturday and Sunday for about 30 minutes. I was a little sore after Saturday but not like last time. I can feel it more walking on uneven ground than level ground."    Pertinent History See PMH above    Limitations Walking    How long can you sit comfortably? No limitations    How long can you stand comfortably? No limitations    How long can you walk comfortably? No issues with household ambulation but hasn't tried prolonged walking - used to walk 1.5 hours    Diagnostic tests [name] 05/24/2020 reveals L knee medial meniscal tear. Negative radiograph of L knee 05/24/2020.    Patient Stated Goals Return to tennis at full  capacity and going for walks; return to regular gym routine    Currently in Pain? No/denies    Pain Score 0-No pain    Pain Onset More than a month ago              [facility] PT Assessment - 07/05/20 0001      Assessment   Medical Diagnosis Chronic pain of left knee (M25.562, G89.29)    Referring Provider (PT) Judi Saa, DO    Next MD Visit 07/19/2020                         [facility] Adult PT Treatment/Exercise - 07/05/20 0001      Self-Care   Self-Care Other Self-Care Comments    Other Self-Care Comments  See patient education      Knee/Hip Exercises: Aerobic   Stationary Bike L3 x 5 min      Knee/Hip Exercises: Plyometrics   Bilateral Jumping Limitations Jumping AP, ML, and diagonally (each direction) 5x back and forth each direction over a dumbbell on ground    Other Plyometric Exercises Side shuffle outside in parking lot 8 x 25 feet (4x each  direction)    Other Plyometric Exercises Squat jumping x 20 then bilateral jumping without squat x 10      Knee/Hip Exercises: Standing   Heel Raises Right;2 sets;10 reps    Heel Raises Limitations DL for 1st set then L SL for 2nd set    Hip Flexion Stengthening;Left;2 sets;10 reps;Knee straight    Hip Flexion Limitations 3# ankle weight while RLE stance on airex    Forward Lunges Left;2 sets;10 reps    Hip Abduction Stengthening;Left;2 sets;10 reps;Knee straight    Abduction Limitations 3# ankle weight while RLE stance on airex    Hip Extension Stengthening;Left;2 sets;10 reps;Knee straight    Extension Limitations 3# ankle weight while RLE stance on airex    Functional Squat 2 sets;10 reps    Functional Squat Limitations on BOSU (black)    SLS 5 x 15-20 sec on BOSU (blue)    Rebounder 10x throws with red medicine ball - L SLS on dynadisc from peds    Other Standing Knee Exercises L SL mini squat 2 x 15    Other Standing Knee Exercises AP/ML/diagonal quick movements as if moving at light to moderate pace around  tennis court with cones set in all 4 directions with pt moving in various directions) while holding theralog as a "tennis racket"                  PT Education - 07/05/20 1849    Education Details Reviewed HEP    Person(s) Educated Patient    Methods Explanation;Demonstration;Verbal cues    Comprehension Verbalized understanding;Returned demonstration            PT Short Term Goals - 06/25/20 1015      PT SHORT TERM GOAL #1   Title Patient will be independent with initial HEP.    Baseline Pt provided initial HEP at evaluation 06/02/2020.    Status Achieved      PT SHORT TERM GOAL #2   Title Patient will report being able to perform at >/= 50% capacity during tennis practice with </= 4/10 L knee pain.    Baseline Pt taking a month off of tennis    Time 3    Period Weeks    Status On-going    Target Date 06/23/20      PT SHORT TERM GOAL #3   Title Patient will demonstrate L knee FL AROM to 145 degrees without discomfort or pain.    Baseline 140 but no discomfrot noted    Time 3    Period Weeks    Status On-going    Target Date 06/23/20      PT SHORT TERM GOAL #4   Title Patient will be able to walk for at least 1 hour with </= 3/10 L knee pain.    Baseline only walking 20 minutes with no pain    Time 3    Period Weeks    Status On-going    Target Date 06/23/20             PT Long Term Goals - 06/02/20 1433      PT LONG TERM GOAL #1   Title Patient will be independent with advanced HEP.    Baseline Pt provided initial HEP at evaluation 06/02/2020.    Time 6    Period Weeks    Status New    Target Date 07/14/20      PT LONG TERM GOAL #2   Title Patient will report being able  to perform at >/= 75% capacity during tennis practice with </= 3/10 L knee pain.    Baseline Pt has only tried playing tennis once with caution but plans to attend Wednesday practices to ease back into it    Time 6    Period Weeks    Status New    Target Date 07/14/20      PT  LONG TERM GOAL #3   Title Patient will improve LLE MMT to 5/5 grossly.    Baseline See flowsheet    Time 6    Period Weeks    Status New    Target Date 07/14/20      PT LONG TERM GOAL #4   Title Patient's FOTO score will increase from 69% to 81% functional status to demonstrate improved perceived ability.    Baseline 69% functional status; predicted 81% function    Time 6    Period Weeks    Status New    Target Date 07/14/20      PT LONG TERM GOAL #5   Title Patient will report being able to resume to regular walking and gym routine without significant pain or discomfort.    Baseline Pt has not attempted regular walking/gym routine at this time    Time 6    Period Weeks    Status New    Target Date 07/14/20                 Plan - 07/05/20 1450    Clinical Impression Statement Patient tolerated session well with no adverse effects and L knee pain level ranging from 0.5-1.25/10 during various activities this session. Pt experienced difficulty and pain in R knee primarily when stepping backwards and diagonally backwards. She was able to perform plyometric interventions with minimal discomfort but no limitations. She should continue to benefit from skilled PT intervention to progress strengthening and continue exercises that will allow safer return to desired recreational activities, such as tennis.    Personal Factors and Comorbidities Age;Past/Current Experience;Time since onset of injury/illness/exacerbation    Examination-Activity Limitations Locomotion Level;Squat    PT Treatment/Interventions ADLs/Self Care Home Management;Aquatic Therapy;Cryotherapy;Electrical Stimulation;Iontophoresis 4mg /ml Dexamethasone;Moist Heat;Neuromuscular re-education;Balance training;Therapeutic exercise;Therapeutic activities;Functional mobility training;Stair training;Gait training;Patient/family education;Manual techniques;Energy conservation;Dry needling;Passive range of motion;Taping    PT Next  Visit Plan Assess STG. Further progress weight bearing CKC knee flexion exercises, meniscus squeeze MWM. Continue cone drills. Consider agility ladder. Side shuffling and light activity outside    PT Home Exercise Plan [id number]    Consulted and Agree with Plan of Care Patient           Patient will benefit from skilled therapeutic intervention in order to improve the following deficits and impairments:  Decreased activity tolerance,Decreased mobility,Decreased endurance,Difficulty walking,Pain  Visit Diagnosis: Chronic pain of left knee  Stiffness of left knee, not elsewhere classified  Other abnormalities of gait and mobility  Muscle weakness (generalized)     Problem List Patient Active Problem List   Diagnosis Date Noted  . Other hyperlipidemia 07/01/2020  . Insulin resistance 07/01/2020  . Vitamin D deficiency 05/19/2020  . Left knee pain 05/19/2020  . At risk for activity intolerance 05/19/2020  . Low back pain 09/25/2019  . Nonallopathic lesion of sacral region 09/25/2019  . Nonallopathic lesion of thoracic region 09/25/2019  . Nonallopathic lesion of lumbar region 09/25/2019  . Degenerative tear of medial meniscus, left 08/21/2019  . Cervical dysplasia 06/10/2012     Rhea BleacherKajal Elizjah Noblet, PT, DPT 07/05/20 9:03 PM  [facility] [address] [place], [place], [address] Phone: [phone]   Fax:  [phone]  Name: Betty Price MRN: [id number] Date of Birth: 1966-03-11

## 2020-07-05 NOTE — Progress Notes (Signed)
Chief Complaint:   OBESITY Betty Price is here to discuss her progress with her obesity treatment plan along with follow-up of her obesity related diagnoses.   Today's visit was #: 12 Starting weight: 179 lbs Starting date: 06/09/2020 Today's weight: 166 lbs Today's date: 07/01/2020 Total lbs lost to date: 13 lbs Body mass index is 28.49 kg/m.  Total weight loss percentage to date: -7.26%  Interim History:  Betty Price is going to Physical Therapy for her knee on [employer]. through [place].  She says her knee has been bothering her since December 2021.  Working with Dr. Terrilee Files.  Still limiting her ability to exercise and play tennis.  She says she has been on the meal plan much more lately.  Current Meal Plan: the Category 1 Plan for 70% of the time.  Current Exercise Plan: PT for 30 minutes 2 times per week.  Assessment/Plan:   1. Vitamin D deficiency Improving, but not optimized. Current vitamin D is 47.1, tested on 10/21/2019. Optimal goal > 50 ng/dL.  She is taking vitamin D 50,000 IU daily.  Plan: Continue to take prescription Vitamin D @50 ,000 IU every week as prescribed.  Will check vitamin D level today, as per below.  - Refill Vitamin D, Ergocalciferol, (DRISDOL) 1.25 MG (50000 UNIT) CAPS capsule; Take 1 capsule (50,000 Units total) by mouth every 7 (seven) days.  Dispense: 4 capsule; Refill: 0 - VITAMIN D 25 Hydroxy (Vit-D Deficiency, Fractures)  2. Other hyperlipidemia Course: Not at goal. Lipid-lowering medications: None.  Elevated LDL and HDL.    Plan: Dietary changes: Increase soluble fiber, decrease simple carbohydrates, decrease saturated fat. Exercise changes: Moderate to vigorous-intensity aerobic activity 150 minutes per week or as tolerated. We will continue to monitor along with PCP/specialists as it pertains to her weight loss journey.  Will check FLP today.  Continue prudent nutritional plan with lean proteins.  Lab Results  Component Value Date   CHOL 241 (H)  07/01/2020   HDL 88 07/01/2020   LDLCALC 142 (H) 07/01/2020   TRIG 68 07/01/2020   CHOLHDL 2.7 07/01/2020   Lab Results  Component Value Date   ALT 21 10/21/2019   AST 20 10/21/2019   ALKPHOS 92 10/21/2019   BILITOT 0.5 10/21/2019   The 10-year ASCVD risk score 10/23/2019 DC Jr., et al., 2013) is: 1.3%   Values used to calculate the score:     Age: 55 years     Sex: Female     Is Non-Hispanic African American: No     Diabetic: No     Tobacco smoker: No     Systolic Blood Pressure: 122 mmHg     Is BP treated: No     HDL Cholesterol: 88 mg/dL     Total Cholesterol: 241 mg/dL  - Lipid panel  3. Insulin resistance Improving, but not optimized. Goal is HgbA1c < 5.7, fasting insulin closer to 5.  Medication: Diet controlled.    Plan:  She will continue to focus on protein-rich, low simple carbohydrate foods. We reviewed the importance of hydration, regular exercise for stress reduction, and restorative sleep.  Will check fasting insulin and A1c today.  Continue prudent nutritional plan and weight loss.  Lab Results  Component Value Date   HGBA1C 5.6 07/01/2020   Lab Results  Component Value Date   INSULIN 7.2 07/01/2020   INSULIN 9.7 10/21/2019   - Insulin, random - Hemoglobin A1c  4. At risk for activity intolerance Betty Price was given approximately  9 minutes of counseling today regarding her increased risk for exercise intolerance.  We discussed patient's specific personal and medical issues that raise our concern.  She was advised of strategies to prevent injury and ways to improve her cardiopulmonary fitness levels slowly over time.  We additionally discussed various fitness trackers and smart phone apps to help motivate the patient to stay on track.   5. Class 1 obesity with serious comorbidity and body mass index (BMI) of 30.0 to 30.9 in adult, unspecified obesity type  Course: Betty Price is currently in the action stage of change. As such, her goal is to continue with weight loss  efforts.   Nutrition goals: She has agreed to following a lower carbohydrate, vegetable and lean protein rich diet plan.   Exercise goals: As is, but add upper body strengthening classes.  Peloton app is recommended.  Behavioral modification strategies: increasing lean protein intake, increasing water intake, better snacking choices, celebration eating strategies and avoiding temptations.  Betty Price has agreed to follow-up with our clinic in 4 weeks.  Will review labs at that time.  She was informed of the importance of frequent follow-up visits to maximize her success with intensive lifestyle modifications for her multiple health conditions.   Objective:   Blood pressure 122/74, pulse 60, temperature 97.6 F (36.4 C), height 5\' 4"  (1.626 m), weight 166 lb (75.3 kg), SpO2 99 %. Body mass index is 28.49 kg/m.  General: Cooperative, alert, well developed, in no acute distress. HEENT: Conjunctivae and lids unremarkable. Cardiovascular: Regular rhythm.  Lungs: Normal work of breathing. Neurologic: No focal deficits.   Lab Results  Component Value Date   CREATININE 0.91 10/21/2019   BUN 18 10/21/2019   NA 140 10/21/2019   K 4.8 10/21/2019   CL 104 10/21/2019   CO2 22 10/21/2019   Lab Results  Component Value Date   ALT 21 10/21/2019   AST 20 10/21/2019   ALKPHOS 92 10/21/2019   BILITOT 0.5 10/21/2019   Lab Results  Component Value Date   HGBA1C 5.6 07/01/2020   HGBA1C 5.6 10/21/2019   HGBA1C 5.4 11/06/2017   HGBA1C 5.1 09/04/2016   HGBA1C 5.4 07/23/2015   Lab Results  Component Value Date   INSULIN 7.2 07/01/2020   INSULIN 9.7 10/21/2019   Lab Results  Component Value Date   TSH 1.060 10/21/2019   Lab Results  Component Value Date   CHOL 241 (H) 07/01/2020   HDL 88 07/01/2020   LDLCALC 142 (H) 07/01/2020   TRIG 68 07/01/2020   CHOLHDL 2.7 07/01/2020   Lab Results  Component Value Date   WBC 3.7 10/21/2019   HGB 13.4 10/21/2019   HCT 39.0 10/21/2019   MCV 94  10/21/2019   PLT 245 10/21/2019   Attestation Statements:   Reviewed by clinician on day of visit: allergies, medications, problem list, medical history, surgical history, family history, social history, and previous encounter notes.  I, 10/23/2019, CMA, am acting as Insurance claims handler for Energy manager, DO.  I have reviewed the above documentation for accuracy and completeness, and I agree with the above. [employer], D.O.  The 21st Century Cures Act was signed into law in 2016 which includes the topic of electronic health records.  This provides immediate access to information in MyChart.  This includes consultation notes, operative notes, office notes, lab results and pathology reports.  If you have any questions about what you read please let 2017 know at your next visit so we can  discuss your concerns and take corrective action if need be.  We are right here with you.

## 2020-07-07 ENCOUNTER — Ambulatory Visit: Payer: Managed Care, Other (non HMO)

## 2020-07-07 ENCOUNTER — Ambulatory Visit: Payer: Managed Care, Other (non HMO) | Admitting: Physical Therapy

## 2020-07-07 ENCOUNTER — Other Ambulatory Visit: Payer: Self-pay

## 2020-07-07 DIAGNOSIS — M25662 Stiffness of left knee, not elsewhere classified: Secondary | ICD-10-CM

## 2020-07-07 DIAGNOSIS — M25562 Pain in left knee: Secondary | ICD-10-CM

## 2020-07-07 DIAGNOSIS — M6281 Muscle weakness (generalized): Secondary | ICD-10-CM

## 2020-07-07 DIAGNOSIS — R2689 Other abnormalities of gait and mobility: Secondary | ICD-10-CM

## 2020-07-07 DIAGNOSIS — G8929 Other chronic pain: Secondary | ICD-10-CM

## 2020-07-07 NOTE — Therapy (Addendum)
[facility] [address] [place], [place], [address] Phone: [phone]   Fax:  [phone]  Physical Therapy Treatment  Patient Details  Name: Betty Price MRN: [id number] Date of Birth: 03-Oct-1965 Referring Provider (PT): Judi Saa, DO   Encounter Date: 07/07/2020   PT End of Session - 07/08/20 0812     Visit Number 8    Number of Visits 13    Date for PT Re-Evaluation 07/17/20    Authorization Type Cign[employer] - VASO note covered; FOTO visit 10    PT Start Time 1533    PT Stop Time 1615    PT Time Calculation (min) 42 min    Activity Tolerance Patient tolerated treatment well;No increased pain    Behavior During Therapy WFL for tasks assessed/performed             Past Medical History:  Diagnosis Date   Acute lateral meniscus tear of left knee    Allergy    Back pain    Emotional stress    Food allergy    Gluten free diet    Hives 2012   SAW DR. HICKS   Joint pain     Past Surgical History:  Procedure Laterality Date   GYNECOLOGIC CRYOSURGERY  05/08/2006   cin 1   lasix surgery     MENISCUS REPAIR  10/2009   in 2014 left knee    There were no vitals filed for this visit.   Subjective Assessment - 07/07/20 1539     Subjective walked saturday and sunday for 30 minutes each, a little bit of soreness the next day. I started pilates, my calves are pretty sore from it but I have really enjoyed it.    Currently in Pain? Yes    Pain Score 1     Pain Location Knee    Pain Orientation Left                  [facility] PT Assessment - 07/08/20 0001       AROM   Left Knee Flexion 140   'feels a little swollen'                                       [facility] Adult PT Treatment/Exercise - 07/08/20 0001       Knee/Hip Exercises: Aerobic   Stationary Bike L7 x3 min, L5 x 2 min      Knee/Hip Exercises: Plyometrics   Other Plyometric Exercises skater lunges 2x10 focus on eccentric squat and  tapping opposite arm to opposite leg (twisting through trunk); agility ladder drill footwork x4 exercises 2 sets down and back each    Other Plyometric Exercises randomized side shuffles with squat and cone tap side L and R 2x10      Knee/Hip Exercises: Standing   Functional Squat --   2 sets 6 with green bands, taps onto chair   Functional Squat Limitations spanish squats    SLS L LE SLS 5 x 45 seconds on foam pad with ball toss in random directions                             PT Short Term Goals - 07/07/20 1541       PT SHORT TERM GOAL #1   Title Patient will be independent with initial HEP.  Baseline Pt provided initial HEP at evaluation 06/02/2020.    Status Achieved      PT SHORT TERM GOAL #2   Title Patient will report being able to perform at >/= 50% capacity during tennis practice with </= 4/10 L knee pain.    Baseline Pt taking a month off of tennis    Time 3    Period Weeks    Status On-going    Target Date 06/23/20      PT SHORT TERM GOAL #3   Title Patient will demonstrate L knee FL AROM to 145 degrees without discomfort or pain.    Baseline 140 but no discomfrot noted    Time 3    Period Weeks    Status On-going    Target Date 06/23/20      PT SHORT TERM GOAL #4   Title Patient will be able to walk for at least 1 hour with </= 3/10 L knee pain.    Baseline only walking 20 minutes with no pain    Time 3    Period Weeks    Status On-going    Target Date 06/23/20                PT Long Term Goals - 06/02/20 1433       PT LONG TERM GOAL #1   Title Patient will be independent with advanced HEP.    Baseline Pt provided initial HEP at evaluation 06/02/2020.    Time 6    Period Weeks    Status New    Target Date 07/14/20      PT LONG TERM GOAL #2   Title Patient will report being able to perform at >/= 75% capacity during tennis practice with </= 3/10 L knee pain.    Baseline Pt has only tried playing tennis once with caution but plans to  attend Wednesday practices to ease back into it    Time 6    Period Weeks    Status New    Target Date 07/14/20      PT LONG TERM GOAL #3   Title Patient will improve LLE MMT to 5/5 grossly.    Baseline See flowsheet    Time 6    Period Weeks    Status New    Target Date 07/14/20      PT LONG TERM GOAL #4   Title Patient's FOTO score will increase from 69% to 81% functional status to demonstrate improved perceived ability.    Baseline 69% functional status; predicted 81% function    Time 6    Period Weeks    Status New    Target Date 07/14/20      PT LONG TERM GOAL #5   Title Patient will report being able to resume to regular walking and gym routine without significant pain or discomfort.    Baseline Pt has not attempted regular walking/gym routine at this time    Time 6    Period Weeks    Status New    Target Date 07/14/20                        Plan - 07/07/20 1717     Clinical Impression Statement Pt tolerated session well with no adverse effects and no increase in L knee pain but was fatigued by end of session. Session focused on plyometric type exercises that replicated what she would need to do with tennis. Pt had discomfort  with backwards stepping during plyometrics last session but this session had no discomfort. Pt still lacks some eccentric control with lateral skater lunges this session as well as with spanish squats. Told to go home and ice after session to prevent swelling even though pt stated that she felt okay. Pt would continue to bneefit from skilled PT in order to progress plyometric and CKC strengthening in order to return to desired recreational activities and increase walking distance.    PT Treatment/Interventions ADLs/Self Care Home Management;Aquatic Therapy;Cryotherapy;Electrical Stimulation;Iontophoresis 4mg /ml Dexamethasone;Moist Heat;Neuromuscular re-education;Balance training;Therapeutic exercise;Therapeutic activities;Functional mobility  training;Stair training;Gait training;Patient/family education;Manual techniques;Energy conservation;Dry needling;Passive range of motion;Taping    PT Next Visit Plan plyometric activities, agility ladder, cone drills, shuffling, activity outside, CKC squat strengthening, SLS    PT Home Exercise Plan [email or web address]    Consulted and Agree with Plan of Care Patient             Patient will benefit from skilled therapeutic intervention in order to improve the following deficits and impairments:  Decreased activity tolerance,Decreased mobility,Decreased endurance,Difficulty walking,Pain  Visit Diagnosis: Chronic pain of left knee  Stiffness of left knee, not elsewhere classified  Other abnormalities of gait and mobility  Muscle weakness (generalized)      Problem List Patient Active Problem List   Diagnosis Date Noted   Other hyperlipidemia 07/01/2020   Insulin resistance 07/01/2020   Vitamin D deficiency 05/19/2020   Left knee pain 05/19/2020   At risk for activity intolerance 05/19/2020   Low back pain 09/25/2019   Nonallopathic lesion of sacral region 09/25/2019   Nonallopathic lesion of thoracic region 09/25/2019   Nonallopathic lesion of lumbar region 09/25/2019   Degenerative tear of medial meniscus, left 08/21/2019   Cervical dysplasia 06/10/2012    08/08/2012, SPT 07/08/2020, 8:25 AM  [facility] [address] [place], [place], [place] Phone: [phone]   Fax:  [phone]  Name: Betty Price MRN: Erven Colla Date of Birth: [birth year]

## 2020-07-08 ENCOUNTER — Other Ambulatory Visit: Payer: Self-pay

## 2020-07-08 ENCOUNTER — Encounter: Payer: Self-pay | Admitting: Physical Therapy

## 2020-07-13 ENCOUNTER — Ambulatory Visit: Payer: Managed Care, Other (non HMO) | Admitting: Physical Therapy

## 2020-07-13 ENCOUNTER — Encounter: Payer: Self-pay | Admitting: Physical Therapy

## 2020-07-13 ENCOUNTER — Other Ambulatory Visit: Payer: Self-pay

## 2020-07-13 DIAGNOSIS — M25562 Pain in left knee: Secondary | ICD-10-CM

## 2020-07-13 DIAGNOSIS — G8929 Other chronic pain: Secondary | ICD-10-CM

## 2020-07-13 DIAGNOSIS — R2689 Other abnormalities of gait and mobility: Secondary | ICD-10-CM

## 2020-07-13 DIAGNOSIS — M25662 Stiffness of left knee, not elsewhere classified: Secondary | ICD-10-CM

## 2020-07-13 DIAGNOSIS — M6281 Muscle weakness (generalized): Secondary | ICD-10-CM

## 2020-07-13 NOTE — Therapy (Signed)
[facility] [address] [place], [place], [address] Phone: [phone]   Fax:  [phone]  Physical Therapy Treatment  Patient Details  Name: Betty Price MRN: [id number] Date of Birth: June 30, 1965 Referring Provider (PT): Judi Saa, DO   Encounter Date: 07/13/2020   PT End of Session - 07/13/20 1225    Visit Number 9    Number of Visits 13    Date for PT Re-Evaluation 07/17/20    Authorization Type Cigna - VASO note covered; FOTO visit 10    PT Start Time 1218    PT Stop Time 1300    PT Time Calculation (min) 42 min    Activity Tolerance Patient tolerated treatment well;No increased pain    Behavior During Therapy WFL for tasks assessed/performed           Past Medical History:  Diagnosis Date  . Acute lateral meniscus tear of left knee   . Allergy   . Back pain   . Emotional stress   . Food allergy   . Gluten free diet   . Hives 2012   SAW DR. HICKS  . Joint pain     Past Surgical History:  Procedure Laterality Date  . GYNECOLOGIC CRYOSURGERY  05/08/2006   cin 1  . lasix surgery    . MENISCUS REPAIR  10/2009   in 2014 left knee    There were no vitals filed for this visit.   Subjective Assessment - 07/13/20 1222    Subjective I'm playing tennis tonight.  Thats new! I see Dr. Katrinka Blazing 07/19/20.  She is loving Pilates and plans to do group classes.    Currently in Pain? Yes    Pain Score 1    with knee extension on elliptical   Pain Location Knee    Pain Orientation Left;Medial    Pain Descriptors / Indicators Sore    Pain Type Acute pain    Pain Onset More than a month ago    Pain Frequency Intermittent    Aggravating Factors  when extending knee, jumping to Rt    Pain Relieving Factors rest, PT    Multiple Pain Sites No             [facility] Adult PT Treatment/Exercise - 07/13/20 0001      Knee/Hip Exercises: Stretches   Lobbyist Both;3 reps;30 seconds    Other Knee/Hip Stretches adductor  stretch x 3 , 30 sec  x 3    Other Knee/Hip Stretches standing figure 4 x 30 sec x 3      Knee/Hip Exercises: Aerobic   Elliptical L10 ramp, L 6 resist, 3 min FW and 3 min back      Knee/Hip Exercises: Plyometrics   Bilateral Jumping Limitations squat jumps 2 x 12    Unilateral Jumping Limitations single leg squat hops/jumps , toe touch as needed    Other Plyometric Exercises skater mumps 2 rounds with limited toe tap, balance on single leg    Other Plyometric Exercises retro and lateral jumps (triangle ) 2 rounds      Knee/Hip Exercises: Standing   SLS single leg balance, cone taps, multidirectional    Other Standing Knee Exercises lateral lunges side stepping 2 rounds x 15    Other Standing Knee Exercises BOSU lunge static with challenges in lateral UE motions, (anti-rotation) and then upper trunk rotation                  PT  Education - 07/13/20 1302    Education Details plan to return to PT for 1-2 visits after a MD visit as well as time playing tennis, trial of no PT    Person(s) Educated Patient    Methods Explanation    Comprehension Verbalized understanding;Returned demonstration            PT Short Term Goals - 07/07/20 1541      PT SHORT TERM GOAL #1   Title Patient will be independent with initial HEP.    Baseline Pt provided initial HEP at evaluation 06/02/2020.    Status Achieved      PT SHORT TERM GOAL #2   Title Patient will report being able to perform at >/= 50% capacity during tennis practice with </= 4/10 L knee pain.    Baseline Pt taking a month off of tennis    Time 3    Period Weeks    Status On-going    Target Date 06/23/20      PT SHORT TERM GOAL #3   Title Patient will demonstrate L knee FL AROM to 145 degrees without discomfort or pain.    Baseline 140 but no discomfrot noted    Time 3    Period Weeks    Status On-going    Target Date 06/23/20      PT SHORT TERM GOAL #4   Title Patient will be able to walk for at least 1 hour with  </= 3/10 L knee pain.    Baseline only walking 20 minutes with no pain    Time 3    Period Weeks    Status On-going    Target Date 06/23/20             PT Long Term Goals - 06/02/20 1433      PT LONG TERM GOAL #1   Title Patient will be independent with advanced HEP.    Baseline Pt provided initial HEP at evaluation 06/02/2020.    Time 6    Period Weeks    Status New    Target Date 07/14/20      PT LONG TERM GOAL #2   Title Patient will report being able to perform at >/= 75% capacity during tennis practice with </= 3/10 L knee pain.    Baseline Pt has only tried playing tennis once with caution but plans to attend Wednesday practices to ease back into it    Time 6    Period Weeks    Status New    Target Date 07/14/20      PT LONG TERM GOAL #3   Title Patient will improve LLE MMT to 5/5 grossly.    Baseline See flowsheet    Time 6    Period Weeks    Status New    Target Date 07/14/20      PT LONG TERM GOAL #4   Title Patient's FOTO score will increase from 69% to 81% functional status to demonstrate improved perceived ability.    Baseline 69% functional status; predicted 81% function    Time 6    Period Weeks    Status New    Target Date 07/14/20      PT LONG TERM GOAL #5   Title Patient will report being able to resume to regular walking and gym routine without significant pain or discomfort.    Baseline Pt has not attempted regular walking/gym routine at this time    Time 6    Period Weeks  Status New    Target Date 07/14/20                 Plan - 07/13/20 1220    Clinical Impression Statement Patient challenged with light plyometrics , multidirectional with little to no pain .  She plans to begin playing tennis, see Dr.Smith and then re-assess.  If all goes well, she will likely consider returningx 1 and then discharge from skiled PT.  Had some trouble with pushing off with LLE but none with landing and balance quite good.    PT  Treatment/Interventions ADLs/Self Care Home Management;Aquatic Therapy;Cryotherapy;Electrical Stimulation;Iontophoresis 4mg /ml Dexamethasone;Moist Heat;Neuromuscular re-education;Balance training;Therapeutic exercise;Therapeutic activities;Functional mobility training;Stair training;Gait training;Patient/family education;Manual techniques;Energy conservation;Dry needling;Passive range of motion;Taping    PT Next Visit Plan how was MD, tennis? DC vs renew for 1-2 vists if needed    PT Home Exercise Plan [email or web address]    Consulted and Agree with Plan of Care Patient           Patient will benefit from skilled therapeutic intervention in order to improve the following deficits and impairments:  Decreased activity tolerance,Decreased mobility,Decreased endurance,Difficulty walking,Pain  Visit Diagnosis: Chronic pain of left knee  Stiffness of left knee, not elsewhere classified  Other abnormalities of gait and mobility  Muscle weakness (generalized)     Problem List Patient Active Problem List   Diagnosis Date Noted  . Other hyperlipidemia 07/01/2020  . Insulin resistance 07/01/2020  . Vitamin D deficiency 05/19/2020  . Left knee pain 05/19/2020  . At risk for activity intolerance 05/19/2020  . Low back pain 09/25/2019  . Nonallopathic lesion of sacral region 09/25/2019  . Nonallopathic lesion of thoracic region 09/25/2019  . Nonallopathic lesion of lumbar region 09/25/2019  . Degenerative tear of medial meniscus, left 08/21/2019  . Cervical dysplasia 06/10/2012    Cal Gindlesperger 07/13/2020, 1:08 PM  [facility] [address] [place], [place], [place] Phone: [phone]   Fax:  [phone]  Name: Betty Price MRN: Erven Colla Date of Birth: 1965/10/22  10/17/1965, PT 07/13/20 1:08 PM Phone: [phone] Fax: [phone]

## 2020-07-16 NOTE — Progress Notes (Signed)
[name] [address] [place] [address] Phone: [phone] Subjective:   Bruce Donath, am serving as a scribe for Dr. Antoine Primas. This visit occurred during the SARS-CoV-2 public health emergency.  Safety protocols were in place, including screening questions prior to the visit, additional usage of staff PPE, and extensive cleaning of exam room while observing appropriate contact time as indicated for disinfecting solutions.  I'm seeing this patient by the request  of:  Lewis Moccasin, MD  CC: Left knee pain  YKD:[id number]   05/24/2020 Patient does have some mild exacerbation of this underlying problem.  It does appear to have more of an acute on chronic meniscal tear.  We will get x-rays to further evaluate the amount of arthritic changes.  Discussed icing regimen, topical anti-inflammatories and will start with formal physical therapy.  Worsening symptoms will consider injection, possible advanced imaging.  Follow-up again in 4 to 6 weeks  Update 3/j21/2022 Betty Price is a 55 y.o. female coming in with complaint of left knee pain. Patient has been doing physical therapy. Has been wearing the brace and has played tennis for the first time last week. Pain is more of a stiffness and sore feeling. Patient does feel like she is improving. Has 2 sessions of physical therapy remaining. Patient forgets about knee while she is on the court but is stating today that she is frustrated with her progress and soreness the next day.       Past Medical History:  Diagnosis Date  . Acute lateral meniscus tear of left knee   . Allergy   . Back pain   . Emotional stress   . Food allergy   . Gluten free diet   . Hives 2012   SAW DR. HICKS  . Joint pain    Past Surgical History:  Procedure Laterality Date  . GYNECOLOGIC CRYOSURGERY  05/08/2006   cin 1  . lasix surgery    . MENISCUS REPAIR  10/2009   in 2014 left knee   Social History    Socioeconomic History  . Marital status: Married    Spouse name: Not on file  . Number of children: Not on file  . Years of education: Not on file  . Highest education level: Not on file  Occupational History  . Occupation: Haematologist  Tobacco Use  . Smoking status: Never Smoker  . Smokeless tobacco: Never Used  Vaping Use  . Vaping Use: Never used  Substance and Sexual Activity  . Alcohol use: Yes    Alcohol/week: 0.0 standard drinks    Comment: occassional  . Drug use: No  . Sexual activity: Yes    Comment: INTERCOURSE AGE 78, SEXUAL PARTNERS MORE THAN 5  Other Topics Concern  . Not on file  Social History Narrative  . Not on file   Social Determinants of Health   Financial Resource Strain: Not on file  Food Insecurity: Not on file  Transportation Needs: Not on file  Physical Activity: Not on file  Stress: Not on file  Social Connections: Not on file   No Known Allergies Family History  Problem Relation Age of Onset  . Heart disease Father        died of MI  . Hypertension Father   . Allergic rhinitis Father   . Sudden death Father   . Diabetes Maternal Grandmother   . Allergic rhinitis Mother   . Obesity Mother   .  Colon cancer Neg Hx   . Esophageal cancer Neg Hx   . Pancreatic cancer Neg Hx   . Rectal cancer Neg Hx   . Stomach cancer Neg Hx     Current Outpatient Medications (Endocrine & Metabolic):  .  NP THYROID 30 MG tablet, Take 30 mg by mouth every morning. Marland Kitchen  PROGESTERONE PO, 100 mg.      Current Outpatient Medications (Other):  Marland Kitchen  MAGNESIUM GLYCINATE PO, Take 100 mg by mouth daily. Marland Kitchen  OVER THE COUNTER MEDICATION, Magnesium Citrate 100 mg. One tablet daily. .  Vitamin D, Ergocalciferol, (DRISDOL) 1.25 MG (50000 UNIT) CAPS capsule, Take 1 capsule (50,000 Units total) by mouth every 7 (seven) days.   Reviewed prior external information including notes and imaging from  primary care provider As well as notes that were  available from care everywhere and other healthcare systems.  Past medical history, social, surgical and family history all reviewed in electronic medical record.  No pertanent information unless stated regarding to the chief complaint.   Review of Systems:  No headache, visual changes, nausea, vomiting, diarrhea, constipation, dizziness, abdominal pain, skin rash, fevers, chills, night sweats, weight loss, swollen lymph nodes, body aches, joint swelling, chest pain, shortness of breath, mood changes. POSITIVE muscle aches  Objective  Blood pressure 108/82, pulse 69, height 5\' 4"  (1.626 m), weight 169 lb (76.7 kg), SpO2 99 %.   General: No apparent distress alert and oriented x3 mood and affect normal, dressed appropriately.  HEENT: Pupils equal, extraocular movements intact  Respiratory: Patient's speak in full sentences and does not appear short of breath  Cardiovascular: No lower extremity edema, non tender, no erythema  Gait normal with good balance and coordination.  MSK: Left knee exam actually has improvement in range of motion.  Very mild crepitus noted.  Patient still has tenderness over the medial joint line.  Negative McMurray's though that is an improvement.  Contralateral knee unremarkable.  Limited musculoskeletal ultrasound was performed and interpreted by  Limited ultrasound of patient's left knee shows that patient's patellofemoral swelling is improved.  Patient's lateral meniscus is unremarkable.  Medial meniscus still has some mild displacement but significantly less hypoechoic changes.  Displacement is only approximately 5%. Impression: Interval improvement    Impression and Recommendations:     The above documentation has been reviewed and is accurate and complete Judi Saa, DO

## 2020-07-19 ENCOUNTER — Other Ambulatory Visit: Payer: Self-pay

## 2020-07-19 ENCOUNTER — Ambulatory Visit (INDEPENDENT_AMBULATORY_CARE_PROVIDER_SITE_OTHER): Payer: Managed Care, Other (non HMO) | Admitting: Family Medicine

## 2020-07-19 ENCOUNTER — Ambulatory Visit: Payer: Self-pay

## 2020-07-19 ENCOUNTER — Encounter: Payer: Self-pay | Admitting: Family Medicine

## 2020-07-19 VITALS — BP 108/82 | HR 69 | Ht 64.0 in | Wt 169.0 lb

## 2020-07-19 DIAGNOSIS — M23204 Derangement of unspecified medial meniscus due to old tear or injury, left knee: Secondary | ICD-10-CM | POA: Diagnosis not present

## 2020-07-19 NOTE — Assessment & Plan Note (Signed)
Patient has made some improvement overall.  Do not see as much displacement as we saw previously.  Patient has worked with physical therapy.  I believe that we should hold on any type of the injections at this point.  Increase activity slowly.  Discussed icing regimen.  Follow-up with me again 6 weeks.  At that time if continuing to have pain either consider injection or advanced imaging.

## 2020-07-19 NOTE — Patient Instructions (Signed)
Overall do see improvement Finish PT and then work on your own OK to play tennis  See me again in 6 weeks

## 2020-07-22 ENCOUNTER — Ambulatory Visit: Payer: Managed Care, Other (non HMO) | Admitting: Physical Therapy

## 2020-07-27 ENCOUNTER — Encounter (INDEPENDENT_AMBULATORY_CARE_PROVIDER_SITE_OTHER): Payer: Self-pay | Admitting: Family Medicine

## 2020-07-27 ENCOUNTER — Ambulatory Visit (INDEPENDENT_AMBULATORY_CARE_PROVIDER_SITE_OTHER): Payer: Managed Care, Other (non HMO) | Admitting: Family Medicine

## 2020-07-27 ENCOUNTER — Other Ambulatory Visit: Payer: Self-pay

## 2020-07-27 ENCOUNTER — Ambulatory Visit: Payer: Managed Care, Other (non HMO) | Admitting: Physical Therapy

## 2020-07-27 VITALS — BP 105/66 | HR 69 | Temp 98.2°F | Ht 64.0 in | Wt 164.0 lb

## 2020-07-27 DIAGNOSIS — E669 Obesity, unspecified: Secondary | ICD-10-CM

## 2020-07-27 DIAGNOSIS — E7849 Other hyperlipidemia: Secondary | ICD-10-CM

## 2020-07-27 DIAGNOSIS — Z9189 Other specified personal risk factors, not elsewhere classified: Secondary | ICD-10-CM | POA: Diagnosis not present

## 2020-07-27 DIAGNOSIS — E559 Vitamin D deficiency, unspecified: Secondary | ICD-10-CM | POA: Diagnosis not present

## 2020-07-27 DIAGNOSIS — G4709 Other insomnia: Secondary | ICD-10-CM | POA: Diagnosis not present

## 2020-07-27 DIAGNOSIS — E8881 Metabolic syndrome: Secondary | ICD-10-CM | POA: Diagnosis not present

## 2020-07-27 DIAGNOSIS — Z6830 Body mass index (BMI) 30.0-30.9, adult: Secondary | ICD-10-CM

## 2020-07-27 DIAGNOSIS — E66811 Obesity, class 1: Secondary | ICD-10-CM

## 2020-07-27 DIAGNOSIS — E88819 Insulin resistance, unspecified: Secondary | ICD-10-CM

## 2020-07-27 MED ORDER — VITAMIN D (ERGOCALCIFEROL) 1.25 MG (50000 UNIT) PO CAPS: 50000.0000 [IU] | ORAL_CAPSULE | ORAL | 0 refills | Status: DC

## 2020-07-27 MED ORDER — VITAMIN D 50 MCG (2000 UT) PO TABS: 2000.0000 [IU] | ORAL_TABLET | Freq: Every day | ORAL | Status: DC

## 2020-07-27 MED ORDER — TRAZODONE HCL 50 MG PO TABS: ORAL_TABLET | ORAL | 0 refills | Status: AC

## 2020-07-27 NOTE — Patient Instructions (Signed)
The 10-year ASCVD risk score Denman George DC Montez Hageman., et al., 2013) is: 0.9%   Values used to calculate the score:     Age: 55 years     Sex: Female     Is Non-Hispanic African American: No     Diabetic: No     Tobacco smoker: No     Systolic Blood Pressure: 105 mmHg     Is BP treated: No     HDL Cholesterol: 88 mg/dL     Total Cholesterol: 241 mg/dL

## 2020-07-28 ENCOUNTER — Ambulatory Visit: Payer: Managed Care, Other (non HMO)

## 2020-07-28 ENCOUNTER — Telehealth: Payer: Self-pay

## 2020-07-28 ENCOUNTER — Other Ambulatory Visit: Payer: Self-pay

## 2020-07-28 DIAGNOSIS — M25562 Pain in left knee: Secondary | ICD-10-CM

## 2020-07-28 DIAGNOSIS — R2689 Other abnormalities of gait and mobility: Secondary | ICD-10-CM

## 2020-07-28 DIAGNOSIS — M6281 Muscle weakness (generalized): Secondary | ICD-10-CM

## 2020-07-28 DIAGNOSIS — G8929 Other chronic pain: Secondary | ICD-10-CM

## 2020-07-28 DIAGNOSIS — M25662 Stiffness of left knee, not elsewhere classified: Secondary | ICD-10-CM

## 2020-07-28 NOTE — Telephone Encounter (Signed)
Called patient to complete FOTO intake for 10th visit via phone call with PT reading each question and option to patient and inputting patient's response accordingly. Patient is familiar with FOTO and questions as she has previously completed it twice on iPad during this POC.  Rhea Bleacher, PT, DPT 07/28/20 12:34 PM

## 2020-07-28 NOTE — Therapy (Addendum)
[place], [place], [address] Phone: [phone]   Fax:  [phone]  Physical Therapy Treatment/Re-evaluation / Discharge  Patient Details  Name: Betty Price MRN: [id number] Date of Birth: [birth year] Referring Provider (PT): Lyndal Pulley, DO   Encounter Date: 07/28/2020   PT End of Session - 07/28/20 1131    Visit Number 10    Number of Visits 16    Date for PT Re-Evaluation 09/11/20   pt plans for 1-2 visits; extended POC to allow for scheduling after pt returns from vacation   Authorization Type [employer] - VASO note covered    PT Start Time 0370    PT Stop Time 1210    PT Time Calculation (min) 39 min    Activity Tolerance Patient tolerated treatment well;No increased pain    Behavior During Therapy WFL for tasks assessed/performed           Past Medical History:  Diagnosis Date  . Acute lateral meniscus tear of left knee   . Allergy   . Back pain   . Emotional stress   . Food allergy   . Gluten free diet   . Hives 2012   SAW DR. HICKS  . Joint pain     Past Surgical History:  Procedure Laterality Date  . GYNECOLOGIC CRYOSURGERY  05/08/2006   cin 1  . lasix surgery    . MENISCUS REPAIR  10/2009   in 2014 left knee    There were no vitals filed for this visit.   Subjective Assessment - 07/28/20 1131    Subjective "The soreness and pain is getting a little less predictable, so I can't tell if there's more damage or what.    Pertinent History See PMH above    Limitations Walking    How long can you sit comfortably? No limitations    How long can you stand comfortably? No limitations    How long can you walk comfortably? No issues with household ambulation but hasn't tried prolonged walking - used to walk 1.5 hours    Diagnostic tests [name] 05/24/2020 reveals L knee medial meniscal tear. Negative radiograph of L knee 05/24/2020.    Patient Stated Goals Return to tennis at full capacity and  going for walks; return to regular gym routine    Pain Onset More than a month ago              [facility] PT Assessment - 07/28/20 0001      Assessment   Medical Diagnosis Chronic pain of left knee (M25.562, G89.29)    Referring Provider (PT) Lyndal Pulley, DO      Observation/Other Assessments   Focus on Therapeutic Outcomes (FOTO)  78% function; predicted 81% function   completed via phone call with PT reading questions and choices to patient and inputting responses accordingly     AROM   Left Knee Extension 0    Left Knee Flexion 140      Strength   Overall Strength Comments BLE MMT 5/5 grossly                         [facility] Adult PT Treatment/Exercise - 07/28/20 0001      Self-Care   Self-Care Other Self-Care Comments    Other Self-Care Comments  See patient education      Knee/Hip Exercises: Public affairs consultant Left;1 rep;Other (comment)   90 seconds  Piriformis Stretch Left;1 rep;60 seconds    Other Knee/Hip Stretches butterfly adductor stretch x 90 seconds    Other Knee/Hip Stretches standing figure 2 x 30 sec      Knee/Hip Exercises: Aerobic   Elliptical L10 ramp, L 6 resist x 1 min then stopped due to increased L knee pain      Knee/Hip Exercises: Standing   Functional Squat Limitations L single leg squat 2 x 15    SLS single leg balance, cone taps, multidirectional (    Other Standing Knee Exercises lunges stepping back with RLE 2 x 15      Knee/Hip Exercises: Seated   Sit to Sand 2 sets;15 reps;without UE support   15# KB     Knee/Hip Exercises: Supine   Straight Leg Raises Strengthening;Left;15 reps      Knee/Hip Exercises: Sidelying   Hip ABduction Strengthening;Left;15 reps      Manual Therapy   Manual therapy comments Meniscal squeeze MWM - Pressure along L knee medial joint line as patient performs AROM. Then pt performed independently                  PT Education - 07/28/20 1237    Education Details Discussed POC  after patient returns from vacation and D/C if she does not schedule any appointments by 08/28/2020, FOTO and progress    Person(s) Educated Patient    Methods Explanation    Comprehension Verbalized understanding            PT Short Term Goals - 07/28/20 1210      PT SHORT TERM GOAL #1   Title Patient will be independent with initial HEP.    Baseline compliant with HEP    Status Achieved      PT SHORT TERM GOAL #2   Title Patient will report being able to perform at >/= 50% capacity during tennis practice with </= 4/10 L knee pain.    Baseline 70% and has been playing at the net more but has pain afterwards    Time 3    Period Weeks    Status Partially Met    Target Date 06/23/20      PT SHORT TERM GOAL #3   Title Patient will demonstrate L knee FL AROM to 145 degrees without discomfort or pain.    Baseline 140 but no discomfort noted    Time 3    Period Weeks    Status On-going    Target Date 06/23/20      PT SHORT TERM GOAL #4   Title Patient will be able to walk for at least 1 hour with </= 3/10 L knee pain.    Baseline has walked 30 minutes without brace but typically has pain later in the day    Time 3    Period Weeks    Status On-going    Target Date 06/23/20             PT Long Term Goals - 07/28/20 1215      PT LONG TERM GOAL #1   Title Patient will be independent with advanced HEP.    Baseline Compliant with HEP    Time 6    Period Weeks    Status Achieved      PT LONG TERM GOAL #2   Title Patient will report being able to perform at >/= 75% capacity during tennis practice with </= 3/10 L knee pain.    Baseline 70% with increased L knee  pain afterwards    Time 6    Period Weeks    Status On-going      PT LONG TERM GOAL #3   Title Patient will improve LLE MMT to 5/5 grossly.    Baseline See flowsheet    Time 6    Period Weeks    Status Achieved      PT LONG TERM GOAL #4   Title Patient's FOTO score will increase from 69% to 81% functional  status to demonstrate improved perceived ability.    Baseline 78% functional status; predicted 81% function    Time 6    Period Weeks    Status On-going      PT LONG TERM GOAL #5   Title Patient will report being able to resume to regular walking and gym routine without significant pain or discomfort.    Baseline Pt has been walking but has not returned to gym regularly - recent return to tennis    Time 6    Period Weeks    Status Partially Met                 Plan - 07/28/20 1132    Clinical Impression Statement Patient presents with increased L knee pain today. She played tennis last night and reports that it has been more difficult to determine what movements aggravate her pain as it has not been consistent. Pt unable to complete warm-up on elliptical for > 1 minute at beginning of session with limitation due to pain. Focus on single leg balance/strengthening, stretches, and general LLE strengthening this session. She expresses wanting to continue for at least 1-2 visits then discharging and performing HEP independently, but she will be on vacation through mid-April and plans to call to schedule after she returns if needed and if her work schedule allows. Discussed with patient that we will plan to keep her chart open to allow for scheduling after vacation and will plan to discharge if she does not schedule an appointment by 08/28/2020. Due to patient's fluctuating pain with return to tennis, she could benefit from the additional visits for pain reduction and continued focus on LLE strengthening and stability.    Personal Factors and Comorbidities Age;Past/Current Experience;Time since onset of injury/illness/exacerbation    Examination-Activity Limitations Locomotion Level;Squat    Examination-Participation Restrictions Community Activity;Other    PT Frequency 1x / week    PT Duration 6 weeks    PT Treatment/Interventions ADLs/Self Care Home Management;Aquatic  Therapy;Cryotherapy;Electrical Stimulation;Iontophoresis 4mg /ml Dexamethasone;Moist Heat;Neuromuscular re-education;Balance training;Therapeutic exercise;Therapeutic activities;Functional mobility training;Stair training;Gait training;Patient/family education;Manual techniques;Energy conservation;Dry needling;Passive range of motion;Taping    PT Next Visit Plan How has knee been with tennis and throughout vacation period? Continue LLE strengthening/stability and plyometric interventions as tolerated    PT Home Exercise Plan [id number]    Consulted and Agree with Plan of Care Patient           Patient will benefit from skilled therapeutic intervention in order to improve the following deficits and impairments:  Decreased activity tolerance,Decreased mobility,Decreased endurance,Difficulty walking,Pain  Visit Diagnosis: Chronic pain of left knee  Stiffness of left knee, not elsewhere classified  Other abnormalities of gait and mobility  Muscle weakness (generalized)     Problem List Patient Active Problem List   Diagnosis Date Noted  . Other insomnia 07/27/2020  . Other hyperlipidemia 07/01/2020  . Insulin resistance 07/01/2020  . Vitamin D deficiency 05/19/2020  . Left knee pain 05/19/2020  . At risk for activity intolerance 05/19/2020  .  Low back pain 09/25/2019  . Nonallopathic lesion of sacral region 09/25/2019  . Nonallopathic lesion of thoracic region 09/25/2019  . Nonallopathic lesion of lumbar region 09/25/2019  . Degenerative tear of medial meniscus, left 08/21/2019  . Cervical dysplasia 06/10/2012     Haydee Monica, PT, DPT 07/28/20 12:44 PM  [facility] [address] [place], [place], [address] Phone: [phone]   Fax:  [phone]  Name: Betty Price MRN: [id number] Date of Birth: 05/30/1965       PHYSICAL THERAPY DISCHARGE SUMMARY  Visits from Start of Care: 10  Current functional level related to  goals / functional outcomes: See goals   Remaining deficits: Current status unknown   Education / Equipment: HEP  Plan: Patient agrees to discharge.  Patient goals were not met. Patient is being discharged due to not returning since the last visit.  ?????         Kristoffer Leamon PT, DPT, LAT, ATC  09/07/20  2:02 PM

## 2020-07-29 ENCOUNTER — Encounter (INDEPENDENT_AMBULATORY_CARE_PROVIDER_SITE_OTHER): Payer: Self-pay

## 2020-08-04 NOTE — Progress Notes (Signed)
Chief Complaint:   OBESITY Betty Price is here to discuss her progress with her obesity treatment plan along with follow-up of her obesity related diagnoses.   Today's visit was #: 13 Starting weight: 179 lbs Starting date: 06/09/2020 Today's weight: 164 lbs Today's date: 07/27/2020 Total lbs lost to date: 15 lbs Body mass index is 28.15 kg/m.  Total weight loss percentage to date: -8.38%  Interim History:  Betty Price is here for a follow up office visit and she is following the meal plan without concerns or issues.  Patient's meal and food recall appears to be accurate and consistent with what is on the plan.  When on plan, her hunger and cravings are well controlled.    Betty Price has lost 2 pounds since her last office visit.  I reviewed recent labs with her today.  Current Meal Plan: following a lower carbohydrate, vegetable and lean protein rich diet plan for 70% of the time.  Current Exercise Plan: Physical therapy for 30-45 minutes 2 times per week.  Assessment/Plan:   Medications Discontinued During This Encounter  Medication Reason  . Vitamin D, Ergocalciferol, (DRISDOL) 1.25 MG (50000 UNIT) CAPS capsule Reorder     Meds ordered this encounter  Medications  . Vitamin D, Ergocalciferol, (DRISDOL) 1.25 MG (50000 UNIT) CAPS capsule    Sig: Take 1 capsule (50,000 Units total) by mouth every 7 (seven) days.    Dispense:  4 capsule    Refill:  0  . traZODone (DESYREL) 50 MG tablet    Sig: 1-2 po q h prn sleep    Dispense:  60 tablet    Refill:  0  . Cholecalciferol (VITAMIN D) 50 MCG (2000 UT) tablet    Sig: Take 1 tablet (2,000 Units total) by mouth daily.     1. Other insomnia This is poorly controlled. Dysfunction: difficulty falling asleep. Increased stress of work/life.  Denies depression/anxiety.  Current treatment: None.  Plan:  Discussed labs with patient today.  Start trazodone after discussion of risks and benefits of medication.  Also discussed sleep apps such as  CALM.   Recommend sleep hygiene measures including regular sleep schedule, optimal sleep environment, and relaxing presleep rituals.   - Start traZODone (DESYREL) 50 MG tablet; 1-2 po q h prn sleep  Dispense: 60 tablet; Refill: 0  2. Vitamin D deficiency Not at goal. Current vitamin D is 44.7, tested on 07/01/2020. Optimal goal > 50 ng/dL.  She is taking vitamin D 50,000 IU weekly.   Plan:  Discussed labs with patient today.  Suboptimally controlled.  Continue to take prescription Vitamin D @50 ,000 IU every week as prescribed.  Follow-up for routine testing of Vitamin D, at least 2-3 times per year to avoid over-replacement.  Will add vitamin D 2,000 IU daily.  - Refill Vitamin D, Ergocalciferol, (DRISDOL) 1.25 MG (50000 UNIT) CAPS capsule; Take 1 capsule (50,000 Units total) by mouth every 7 (seven) days.  Dispense: 4 capsule; Refill: 0 - Cholecalciferol (VITAMIN D) 50 MCG (2000 UT) tablet; Take 1 tablet (2,000 Units total) by mouth daily.  3. Insulin resistance Improving.  Goal is HgbA1c < 5.7, fasting insulin closer to 5.  Medication: None.    Plan:  Discussed labs with patient today.  She will continue to focus on protein-rich, low simple carbohydrate foods. We reviewed the importance of hydration, regular exercise for stress reduction, and restorative sleep.  Improving insulin level.  Lab Results  Component Value Date   HGBA1C 5.6 07/01/2020  Lab Results  Component Value Date   INSULIN 7.2 07/01/2020   INSULIN 9.7 10/21/2019   4. Other hyperlipidemia Course: Not at goal. Lipid-lowering medications: None.   Plan:  Discussed labs with patient today.  Dietary changes: Increase soluble fiber, decrease simple carbohydrates, decrease saturated fat. Exercise changes: Moderate to vigorous-intensity aerobic activity 150 minutes per week or as tolerated. We will continue to monitor along with PCP/specialists as it pertains to her weight loss journey.  Improving HDL and LDL.  No need for  medication.  Continue lifestyle modifications.  Lab Results  Component Value Date   CHOL 241 (H) 07/01/2020   HDL 88 07/01/2020   LDLCALC 142 (H) 07/01/2020   TRIG 68 07/01/2020   CHOLHDL 2.7 07/01/2020   Lab Results  Component Value Date   ALT 21 10/21/2019   AST 20 10/21/2019   ALKPHOS 92 10/21/2019   BILITOT 0.5 10/21/2019   The 10-year ASCVD risk score Denman George DC Jr., et al., 2013) is: 0.9%   Values used to calculate the score:     Age: 68 years     Sex: Female     Is Non-Hispanic African American: No     Diabetic: No     Tobacco smoker: No     Systolic Blood Pressure: 105 mmHg     Is BP treated: No     HDL Cholesterol: 88 mg/dL     Total Cholesterol: 241 mg/dL  5. At risk for impaired metabolic function Due to Betty Price's current state of health and medical condition(s), she is at a significantly higher risk for impaired metabolic function.   At least 9 minutes was spent on counseling Betty Price about these concerns today.  This places the patient at a much greater risk to subsequently develop cardio-pulmonary conditions that can negatively affect the patient's quality of life.  I stressed the importance of reversing these risks factors.  The initial goal is to lose at least 5-10% of starting weight to help reduce risk factors.  Counseling:  Intensive lifestyle modifications discussed with Betty Price as the most appropriate first line treatment.  she will continue to work on diet, exercise, and weight loss efforts.  We will continue to reassess these conditions on a fairly regular basis in an attempt to decrease the patient's overall morbidity and mortality.  6. Obesity current BMI 28.2  Course: Betty Price is currently in the action stage of change. As such, her goal is to continue with weight loss efforts.   Nutrition goals: She has agreed to the Category 1 Plan (she was on this prior and misses it).   Exercise goals: As is.  Behavioral modification strategies: meal planning and cooking  strategies and avoiding temptations.  Betty Price has agreed to follow-up with our clinic in 4 weeks. She was informed of the importance of frequent follow-up visits to maximize her success with intensive lifestyle modifications for her multiple health conditions.   Objective:   Blood pressure 105/66, pulse 69, temperature 98.2 F (36.8 C), height 5\' 4"  (1.626 m), weight 164 lb (74.4 kg), SpO2 98 %. Body mass index is 28.15 kg/m.  General: Cooperative, alert, well developed, in no acute distress. HEENT: Conjunctivae and lids unremarkable. Cardiovascular: Regular rhythm.  Lungs: Normal work of breathing. Neurologic: No focal deficits.   Lab Results  Component Value Date   CREATININE 0.91 10/21/2019   BUN 18 10/21/2019   NA 140 10/21/2019   K 4.8 10/21/2019   CL 104 10/21/2019   CO2 22 10/21/2019  Lab Results  Component Value Date   ALT 21 10/21/2019   AST 20 10/21/2019   ALKPHOS 92 10/21/2019   BILITOT 0.5 10/21/2019   Lab Results  Component Value Date   HGBA1C 5.6 07/01/2020   HGBA1C 5.6 10/21/2019   HGBA1C 5.4 11/06/2017   HGBA1C 5.1 09/04/2016   HGBA1C 5.4 07/23/2015   Lab Results  Component Value Date   INSULIN 7.2 07/01/2020   INSULIN 9.7 10/21/2019   Lab Results  Component Value Date   TSH 1.060 10/21/2019   Lab Results  Component Value Date   CHOL 241 (H) 07/01/2020   HDL 88 07/01/2020   LDLCALC 142 (H) 07/01/2020   TRIG 68 07/01/2020   CHOLHDL 2.7 07/01/2020   Lab Results  Component Value Date   WBC 3.7 10/21/2019   HGB 13.4 10/21/2019   HCT 39.0 10/21/2019   MCV 94 10/21/2019   PLT 245 10/21/2019   Attestation Statements:   Reviewed by clinician on day of visit: allergies, medications, problem list, medical history, surgical history, family history, social history, and previous encounter notes.  I, Insurance claims handler, CMA, am acting as Energy manager for [employer], DO.  I have reviewed the above documentation for accuracy and completeness,  and I agree with the above. Carlye Grippe, D.O.  The 21st Century Cures Act was signed into law in 2016 which includes the topic of electronic health records.  This provides immediate access to information in MyChart.  This includes consultation notes, operative notes, office notes, lab results and pathology reports.  If you have any questions about what you read please let us know at your next visit so we can discuss your concerns and take corrective action if need be.  We are right here with you.

## 2020-08-09 ENCOUNTER — Ambulatory Visit: Payer: Managed Care, Other (non HMO) | Admitting: Physical Therapy

## 2020-08-11 ENCOUNTER — Ambulatory Visit: Payer: Managed Care, Other (non HMO) | Admitting: Physical Therapy

## 2020-08-24 ENCOUNTER — Ambulatory Visit (INDEPENDENT_AMBULATORY_CARE_PROVIDER_SITE_OTHER): Payer: Managed Care, Other (non HMO) | Admitting: Family Medicine

## 2020-09-01 NOTE — Progress Notes (Signed)
[name] [address] [place] [address] Phone: [phone] Subjective:   Bruce Donath, am serving as a scribe for Dr. Antoine Primas. This visit occurred during the SARS-CoV-2 public health emergency.  Safety protocols were in place, including screening questions prior to the visit, additional usage of staff PPE, and extensive cleaning of exam room while observing appropriate contact time as indicated for disinfecting solutions.   I'm seeing this patient by the request  of:  Lewis Moccasin, MD  CC: knee pain   [id number]   07/19/2020 Patient has made some improvement overall.  Do not see as much displacement as we saw previously.  Patient has worked with physical therapy.  I believe that we should hold on any type of the injections at this point.  Increase activity slowly.  Discussed icing regimen.  Follow-up with me again 6 weeks.  At that time if continuing to have pain either consider injection or advanced imaging.  Update 09/02/2020 KITTY CADAVID is a 55 y.o. female coming in with complaint of L knee pain. Has been able to play with knee brace and wears topical patches. No change in pain since last visit though.  Patient states continues to be fairly active.      Past Medical History:  Diagnosis Date  . Acute lateral meniscus tear of left knee   . Allergy   . Back pain   . Emotional stress   . Food allergy   . Gluten free diet   . Hives 2012   SAW DR. HICKS  . Joint pain    Past Surgical History:  Procedure Laterality Date  . GYNECOLOGIC CRYOSURGERY  05/08/2006   cin 1  . lasix surgery    . MENISCUS REPAIR  10/2009   in 2014 left knee   Social History   Socioeconomic History  . Marital status: Married    Spouse name: Not on file  . Number of children: Not on file  . Years of education: Not on file  . Highest education level: Not on file  Occupational History  . Occupation: Haematologist  Tobacco Use  .  Smoking status: Never Smoker  . Smokeless tobacco: Never Used  Vaping Use  . Vaping Use: Never used  Substance and Sexual Activity  . Alcohol use: Yes    Alcohol/week: 0.0 standard drinks    Comment: occassional  . Drug use: No  . Sexual activity: Yes    Comment: INTERCOURSE AGE 19, SEXUAL PARTNERS MORE THAN 5  Other Topics Concern  . Not on file  Social History Narrative  . Not on file   Social Determinants of Health   Financial Resource Strain: Not on file  Food Insecurity: Not on file  Transportation Needs: Not on file  Physical Activity: Not on file  Stress: Not on file  Social Connections: Not on file   No Known Allergies Family History  Problem Relation Age of Onset  . Heart disease Father        died of MI  . Hypertension Father   . Allergic rhinitis Father   . Sudden death Father   . Diabetes Maternal Grandmother   . Allergic rhinitis Mother   . Obesity Mother   . Colon cancer Neg Hx   . Esophageal cancer Neg Hx   . Pancreatic cancer Neg Hx   . Rectal cancer Neg Hx   . Stomach cancer Neg Hx     Current Outpatient Medications (  Endocrine & Metabolic):  .  NP THYROID 30 MG tablet, Take 30 mg by mouth every morning. Marland Kitchen  PROGESTERONE PO, 100 mg.      Current Outpatient Medications (Other):  Marland Kitchen  Cholecalciferol (VITAMIN D) 50 MCG (2000 UT) tablet, Take 1 tablet (2,000 Units total) by mouth daily. Marland Kitchen  MAGNESIUM GLYCINATE PO, Take 100 mg by mouth daily. Marland Kitchen  OVER THE COUNTER MEDICATION, Magnesium Citrate 100 mg. One tablet daily. .  traZODone (DESYREL) 50 MG tablet, 1-2 po q h prn sleep .  Vitamin D, Ergocalciferol, (DRISDOL) 1.25 MG (50000 UNIT) CAPS capsule, Take 1 capsule (50,000 Units total) by mouth every 7 (seven) days.   Reviewed prior external information including notes and imaging from  primary care provider As well as notes that were available from care everywhere and other healthcare systems.  Past medical history, social, surgical and family  history all reviewed in electronic medical record.  No pertanent information unless stated regarding to the chief complaint.   Review of Systems:  No headache, visual changes, nausea, vomiting, diarrhea, constipation, dizziness, abdominal pain, skin rash, fevers, chills, night sweats, weight loss, swollen lymph nodes, body aches, joint swelling, chest pain, shortness of breath, mood changes. POSITIVE muscle aches  Objective  Blood pressure 120/72, pulse 66, height 5\' 4"  (1.626 m), weight 170 lb (77.1 kg), SpO2 99 %.   General: No apparent distress alert and oriented x3 mood and affect normal, dressed appropriately.  HEENT: Pupils equal, extraocular movements intact  Respiratory: Patient's speak in full sentences and does not appear short of breath  Cardiovascular: No lower extremity edema, non tender, no erythema  Gait normal with good balance and coordination.  MSK: Left knee exam shows the patient is tender to palpation over the medial joint space.  Mild positive McMurray still noted.  No significant instability of the knee though noted.  Trace effusion noted of the patellofemoral joint.  Limited musculoskeletal ultrasound was performed and interpreted by  Limited ultrasound of patient's left knee shows that patient does have still some mild displacement of the medial meniscus noted on the posterior aspect.  Hypoechoic changes though is improved from previous exam. Impression: Interval improvement  After informed verbal consent, patient was seated on exam table. Left knee was prepped with alcohol swab and utilizing anterolateral approach, patient's left knee space was injected with 2:1  marcaine 0.5%: Kenalog 40mg /dL. Patient tolerated the procedure well without immediate complications.    Impression and Recommendations:     The above documentation has been reviewed and is accurate and complete Judi Saa, DO

## 2020-09-02 ENCOUNTER — Ambulatory Visit (INDEPENDENT_AMBULATORY_CARE_PROVIDER_SITE_OTHER): Payer: Managed Care, Other (non HMO) | Admitting: Family Medicine

## 2020-09-02 ENCOUNTER — Ambulatory Visit: Payer: Self-pay

## 2020-09-02 ENCOUNTER — Other Ambulatory Visit: Payer: Self-pay

## 2020-09-02 ENCOUNTER — Encounter: Payer: Self-pay | Admitting: Family Medicine

## 2020-09-02 VITALS — BP 120/72 | HR 66 | Ht 64.0 in | Wt 170.0 lb

## 2020-09-02 DIAGNOSIS — M23204 Derangement of unspecified medial meniscus due to old tear or injury, left knee: Secondary | ICD-10-CM | POA: Diagnosis not present

## 2020-09-02 DIAGNOSIS — M25562 Pain in left knee: Secondary | ICD-10-CM | POA: Diagnosis not present

## 2020-09-02 NOTE — Assessment & Plan Note (Signed)
Patient given injection today.  Tolerated the procedure well.  Near complete resolution of pain almost immediately.  Discussed with patient that if worsening pain or this does not seem to help I do feel that advanced imaging would be warranted.  Patient will has been making improvement little by little and hopefully will continue to do so.  Patient will follow up again in 6 to 8 weeks for further evaluation and treatment

## 2020-09-02 NOTE — Patient Instructions (Addendum)
Good to see you Try to stay active when you can See me again in 6-8 weeks

## 2020-09-29 NOTE — Progress Notes (Signed)
[facility] [address] [place] [address] Phone: [phone] Subjective:   I Betty Price am serving as a Neurosurgeon for Dr. Antoine Primas.  This visit occurred during the SARS-CoV-2 public health emergency.  Safety protocols were in place, including screening questions prior to the visit, additional usage of staff PPE, and extensive cleaning of exam room while observing appropriate contact time as indicated for disinfecting solutions.   I'm seeing this patient by the request  of:  Lewis Moccasin, MD  CC: Knee pain follow-up  [id number]   09/02/2020 Patient given injection today.  Tolerated the procedure well.  Near complete resolution of pain almost immediately.  Discussed with patient that if worsening pain or this does not seem to help I do feel that advanced imaging would be warranted.  Patient will has been making improvement little by little and hopefully will continue to do so.  Patient will follow up again in 6 to 8 weeks for further evaluation and treatment  Update 09/30/2020 SHEMEKA Price is a 55 y.o. female coming in with complaint of L knee pain.  Had injection previously of the left knee for an assumed degenerative tear of the medial meniscus.  Patient states the knee is not doing well today. States the pain is getting worse and the pain is different. 3 days after the injection she heard a pop in her knee. Was hard to put weight on it and it was inflamed. Can't play tennis or fully extend. Doesn't feel meniscus pain.      Past Medical History:  Diagnosis Date  . Acute lateral meniscus tear of left knee   . Allergy   . Back pain   . Emotional stress   . Food allergy   . Gluten free diet   . Hives 2012   SAW DR. HICKS  . Joint pain    Past Surgical History:  Procedure Laterality Date  . GYNECOLOGIC CRYOSURGERY  05/08/2006   cin 1  . lasix surgery    . MENISCUS REPAIR  10/2009   in 2014 left knee   Social History    Socioeconomic History  . Marital status: Married    Spouse name: Not on file  . Number of children: Not on file  . Years of education: Not on file  . Highest education level: Not on file  Occupational History  . Occupation: Haematologist  Tobacco Use  . Smoking status: Never Smoker  . Smokeless tobacco: Never Used  Vaping Use  . Vaping Use: Never used  Substance and Sexual Activity  . Alcohol use: Yes    Alcohol/week: 0.0 standard drinks    Comment: occassional  . Drug use: No  . Sexual activity: Yes    Comment: INTERCOURSE AGE 45, SEXUAL PARTNERS MORE THAN 5  Other Topics Concern  . Not on file  Social History Narrative  . Not on file   Social Determinants of Health   Financial Resource Strain: Not on file  Food Insecurity: Not on file  Transportation Needs: Not on file  Physical Activity: Not on file  Stress: Not on file  Social Connections: Not on file   No Known Allergies Family History  Problem Relation Age of Onset  . Heart disease Father        died of MI  . Hypertension Father   . Allergic rhinitis Father   . Sudden death Father   . Diabetes Maternal Grandmother   . Allergic rhinitis  Mother   . Obesity Mother   . Colon cancer Neg Hx   . Esophageal cancer Neg Hx   . Pancreatic cancer Neg Hx   . Rectal cancer Neg Hx   . Stomach cancer Neg Hx     Current Outpatient Medications (Endocrine & Metabolic):  .  NP THYROID 30 MG tablet, Take 30 mg by mouth every morning. Marland Kitchen  PROGESTERONE PO, 100 mg.      Current Outpatient Medications (Other):  Marland Kitchen  Cholecalciferol (VITAMIN D) 50 MCG (2000 UT) tablet, Take 1 tablet (2,000 Units total) by mouth daily. Marland Kitchen  MAGNESIUM GLYCINATE PO, Take 100 mg by mouth daily. Marland Kitchen  OVER THE COUNTER MEDICATION, Magnesium Citrate 100 mg. One tablet daily. .  traZODone (DESYREL) 50 MG tablet, 1-2 po q h prn sleep .  Vitamin D, Ergocalciferol, (DRISDOL) 1.25 MG (50000 UNIT) CAPS capsule, Take 1 capsule (50,000 Units  total) by mouth every 7 (seven) days.   Reviewed prior external information including notes and imaging from  primary care provider As well as notes that were available from care everywhere and other healthcare systems.  Past medical history, social, surgical and family history all reviewed in electronic medical record.  No pertanent information unless stated regarding to the chief complaint.   Review of Systems:  No headache, visual changes, nausea, vomiting, diarrhea, constipation, dizziness, abdominal pain, skin rash, fevers, chills, night sweats, weight loss, swollen lymph nodes, body aches, joint swelling, chest pain, shortness of breath, mood changes. POSITIVE muscle aches  Objective  Blood pressure 118/82, pulse 73, height 5\' 4"  (1.626 m), weight 170 lb (77.1 kg), SpO2 98 %.   General: No apparent distress alert and oriented x3 mood and affect normal, dressed appropriately.  HEENT: Pupils equal, extraocular movements intact  Respiratory: Patient's speak in full sentences and does not appear short of breath  Cardiovascular: No lower extremity edema, non tender, no erythema  Gait normal with good balance and coordination.  MSK: Knee exam shows on the left side shows the patient is still tender to palpation over the medial joint space.  Positive McMurray's is noted.  Patient does lack the last 2 degrees of extension which is a new finding.  Severely tender more little bit over the popliteal area as well in the patellofemoral joint.  Limited musculoskeletal ultrasound was performed and interpreted by  Limited ultrasound of patient's left knee still shows on the medial joint space that patient does still have some what appears to be degenerative findings of the medial meniscus.  Patient does have some displacement noted mostly of the posterior medial aspect. 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

## 2020-09-30 ENCOUNTER — Ambulatory Visit (INDEPENDENT_AMBULATORY_CARE_PROVIDER_SITE_OTHER): Payer: Managed Care, Other (non HMO) | Admitting: Family Medicine

## 2020-09-30 ENCOUNTER — Ambulatory Visit: Payer: Self-pay

## 2020-09-30 ENCOUNTER — Other Ambulatory Visit: Payer: Self-pay

## 2020-09-30 ENCOUNTER — Encounter: Payer: Self-pay | Admitting: Family Medicine

## 2020-09-30 DIAGNOSIS — M23204 Derangement of unspecified medial meniscus due to old tear or injury, left knee: Secondary | ICD-10-CM

## 2020-09-30 NOTE — Patient Instructions (Addendum)
Good to see you Does appear to be meniscus MRI left knee [phone] Will write you in mychart to discuss next steps

## 2020-09-30 NOTE — Assessment & Plan Note (Addendum)
Patient had the injection and for 3 days was feeling significantly better then had an audible pop and now some potential instability of the knee.  Patient has failed conservative therapy including home exercises, physical therapy and injection with now patient having the locking and instability.  Do feel advanced imaging is warranted at this time

## 2020-10-06 ENCOUNTER — Ambulatory Visit
Admission: RE | Admit: 2020-10-06 | Discharge: 2020-10-06 | Disposition: A | Discharge status: Home / Self Care | Payer: Managed Care, Other (non HMO) | Source: Ambulatory Visit | Attending: Family Medicine | Admitting: Family Medicine

## 2020-10-06 ENCOUNTER — Other Ambulatory Visit: Payer: Self-pay

## 2020-10-06 DIAGNOSIS — M23204 Derangement of unspecified medial meniscus due to old tear or injury, left knee: Secondary | ICD-10-CM

## 2020-10-07 ENCOUNTER — Encounter: Payer: Self-pay | Admitting: Family Medicine

## 2020-10-13 ENCOUNTER — Ambulatory Visit (INDEPENDENT_AMBULATORY_CARE_PROVIDER_SITE_OTHER): Payer: Managed Care, Other (non HMO) | Admitting: Family Medicine

## 2020-10-13 ENCOUNTER — Other Ambulatory Visit: Payer: Self-pay

## 2020-10-13 VITALS — BP 123/73 | HR 59 | Temp 97.8°F | Ht 64.0 in | Wt 168.0 lb

## 2020-10-13 DIAGNOSIS — Z6830 Body mass index (BMI) 30.0-30.9, adult: Secondary | ICD-10-CM

## 2020-10-13 DIAGNOSIS — L509 Urticaria, unspecified: Secondary | ICD-10-CM | POA: Diagnosis not present

## 2020-10-13 DIAGNOSIS — M25569 Pain in unspecified knee: Secondary | ICD-10-CM

## 2020-10-13 DIAGNOSIS — E559 Vitamin D deficiency, unspecified: Secondary | ICD-10-CM

## 2020-10-13 DIAGNOSIS — E669 Obesity, unspecified: Secondary | ICD-10-CM | POA: Diagnosis not present

## 2020-10-13 DIAGNOSIS — Z9189 Other specified personal risk factors, not elsewhere classified: Secondary | ICD-10-CM | POA: Diagnosis not present

## 2020-10-13 MED ORDER — VITAMIN D (ERGOCALCIFEROL) 1.25 MG (50000 UNIT) PO CAPS: 50000.0000 [IU] | ORAL_CAPSULE | ORAL | 0 refills | Status: DC

## 2020-10-20 NOTE — Progress Notes (Signed)
[facility] [address] [place] [address] Phone: [phone] Subjective:   Bruce Donath, am serving as a scribe for Dr. Antoine Primas.  This visit occurred during the SARS-CoV-2 public health emergency.  Safety protocols were in place, including screening questions prior to the visit, additional usage of staff PPE, and extensive cleaning of exam room while observing appropriate contact time as indicated for disinfecting solutions.    I'm seeing this patient by the request  of:  Lewis Moccasin, MD  CC: Knee pain follow-up  FIE:[id number]  09/30/2020 Patient had the injection and for 3 days was feeling significantly better then had an audible pop and now some potential instability of the knee.  Patient has failed conservative therapy including home exercises, physical therapy and injection with now patient having the locking and instability.  Do feel advanced imaging is warranted at this time   PERI KREFT is a 55 y.o. female coming in with complaint of L knee pain.  Patient on ultrasound had with continued displacement of a degenerative meniscus tear.  Patient failed conservative therapy and was sent for MRI. Patient has not been playing but wants to try playing with brace as she cannot have surgery until after August. Pain from baker's cyst has subsided but does still have pain with lateral movements from meniscal injury.   MRI L knee 10/06/2020 IMPRESSION: 1. Degenerative-appearing tear of the posterior horn and body of the medial meniscus as described. No displaced meniscal fragment. 2. The lateral meniscus, cruciate and collateral ligaments are intact. 3. 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 4. Partially ruptured small Baker's cyst.     Past Medical History:  Diagnosis Date   Acute lateral meniscus tear of left knee    Allergy    Back pain    Emotional stress    Food allergy    Gluten free  diet    Hives 2012   SAW DR. HICKS   Joint pain    Past Surgical History:  Procedure Laterality Date   GYNECOLOGIC CRYOSURGERY  05/08/2006   cin 1   lasix surgery     MENISCUS REPAIR  10/2009   in 2014 left knee   Social History   Socioeconomic History   Marital status: Married    Spouse name: Not on file   Number of children: Not on file   Years of education: Not on file   Highest education level: Not on file  Occupational History   Occupation: Haematologist  Tobacco Use   Smoking status: Never   Smokeless tobacco: Never  Vaping Use   Vaping Use: Never used  Substance and Sexual Activity   Alcohol use: Yes    Alcohol/week: 0.0 standard drinks    Comment: occassional   Drug use: No   Sexual activity: Yes    Comment: INTERCOURSE AGE 73, SEXUAL PARTNERS MORE THAN 5  Other Topics Concern   Not on file  Social History Narrative   Not on file   Social Determinants of Health   Financial Resource Strain: Not on file  Food Insecurity: Not on file  Transportation Needs: Not on file  Physical Activity: Not on file  Stress: Not on file  Social Connections: Not on file   No Known Allergies Family History  Problem Relation Age of Onset   Heart disease Father        died of MI   Hypertension Father    Allergic  rhinitis Father    Sudden death Father    Diabetes Maternal Grandmother    Allergic rhinitis Mother    Obesity Mother    Colon cancer Neg Hx    Esophageal cancer Neg Hx    Pancreatic cancer Neg Hx    Rectal cancer Neg Hx    Stomach cancer Neg Hx     Current Outpatient Medications (Endocrine & Metabolic):    NP THYROID 30 MG tablet, Take 30 mg by mouth every morning.   PROGESTERONE PO, 100 mg.      Current Outpatient Medications (Other):    Cholecalciferol (VITAMIN D) 50 MCG (2000 UT) tablet, Take 1 tablet (2,000 Units total) by mouth daily.   MAGNESIUM GLYCINATE PO, Take 100 mg by mouth as needed.   traZODone (DESYREL) 50 MG tablet, 1-2  po q h prn sleep (Patient taking differently: 50 mg at bedtime as needed. 1-2 po q h prn sleep)   Vitamin D, Ergocalciferol, (DRISDOL) 1.25 MG (50000 UNIT) CAPS capsule, Take 1 capsule (50,000 Units total) by mouth every 7 (seven) days.   Reviewed prior external information including notes and imaging from  primary care provider As well as notes that were available from care everywhere and other healthcare systems.  Past medical history, social, surgical and family history all reviewed in electronic medical record.  No pertanent information unless stated regarding to the chief complaint.   Review of Systems:  No headache, visual changes, nausea, vomiting, diarrhea, constipation, dizziness, abdominal pain, skin rash, fevers, chills, night sweats, weight loss, swollen lymph nodes, body aches, joint swelling, chest pain, shortness of breath, mood changes.   Objective  Blood pressure 110/76, pulse 84, height 5\' 4"  (1.626 m), weight 168 lb (76.2 kg), SpO2 98 %.   General: No apparent distress alert and oriented x3 mood and affect normal, dressed appropriately.  HEENT: Pupils equal, extraocular movements intact  Respiratory: Patient's speak in full sentences and does not appear short of breath  Cardiovascular: No lower extremity edema, non tender, no erythema  Gait normal with good balance and coordination.  MSK: Left knee exam has trace effusion noted to the contralateral side.  Patient has good range of motion but mild positive McMurray still noted on the medial aspect.  Full range of motion noted no Baker's cyst palpated.    Impression and Recommendations:     The above documentation has been reviewed and is accurate and complete , DO

## 2020-10-21 ENCOUNTER — Ambulatory Visit (INDEPENDENT_AMBULATORY_CARE_PROVIDER_SITE_OTHER): Payer: Managed Care, Other (non HMO) | Admitting: Family Medicine

## 2020-10-21 ENCOUNTER — Other Ambulatory Visit: Payer: Self-pay

## 2020-10-21 ENCOUNTER — Encounter: Payer: Self-pay | Admitting: Family Medicine

## 2020-10-21 DIAGNOSIS — M23204 Derangement of unspecified medial meniscus due to old tear or injury, left knee: Secondary | ICD-10-CM

## 2020-10-21 NOTE — Assessment & Plan Note (Signed)
Patient seems to be doing relatively okay at this point.  Patient does have left posterior horn tear noted of the medial meniscus.  Patient wants to play and does not want to have surgery until August if necessary.  Patient is doing better and I do feel that some of it was the Baker's cyst but is not having any pain on the posterior aspect.  Discussed with patient about staying active and doing home exercises as well as icing protocol.  Follow-up with me again 6 weeks and at that time can consider potentially repeat injection if needed

## 2020-10-21 NOTE — Patient Instructions (Signed)
Ok to play with brace Start with clinics and not competitive Let's see how it does See me in 6 weeks, consider another injection

## 2020-10-25 NOTE — Progress Notes (Signed)
Chief Complaint:   OBESITY Betty Price is here to discuss her progress with her obesity treatment plan along with follow-up of her obesity related diagnoses.   Today's visit was #: 14 Starting weight: 179 lbs Starting date: 06/09/2020 Today's weight: 168 lbs Today's date: 10/13/2020 Weight change since last visit: +4 lbs Total lbs lost to date: 11 lbs Body mass index is 28.84 kg/m.  Total weight loss percentage to date: -6.15%  Interim History:  Betty Price had COVID 3-4 weeks ago and did not eat on plan for 1 week.  She was on vacation as well.  She says she is happy she only gained 4 pounds.  She went to the gym 4 times within the past 2 weeks.  Current Meal Plan: the Category 1 Plan for 0% of the time.  Current Exercise Plan: Going to the gym 4 times within 2 weeks.  Assessment/Plan:   Medications Discontinued During This Encounter  Medication Reason   OVER THE COUNTER MEDICATION Error   Vitamin D, Ergocalciferol, (DRISDOL) 1.25 MG (50000 UNIT) CAPS capsule Reorder   Meds ordered this encounter  Medications   Vitamin D, Ergocalciferol, (DRISDOL) 1.25 MG (50000 UNIT) CAPS capsule    Sig: Take 1 capsule (50,000 Units total) by mouth every 7 (seven) days.    Dispense:  4 capsule    Refill:  0    1. Vitamin D deficiency Improving, but not optimized. Current vitamin D is 44.7, tested on 07/01/2020. Optimal goal > 50 ng/dL.  She is taking vitamin D 50,000 IU weekly and outside more now and in the sun much more so than in March   Plan: Continue to take prescription Vitamin D @50 ,000 IU every week as prescribed.  Follow-up for routine testing of Vitamin D, at least 2-3 times per year to avoid over-replacement.  - Refill Vitamin D, Ergocalciferol, (DRISDOL) 1.25 MG (50000 UNIT) CAPS capsule; Take 1 capsule (50,000 Units total) by mouth every 7 (seven) days.  Dispense: 4 capsule; Refill: 0   2. Hives Positive food allergies- mutliple.  Not sure what exactly is causing hives at this time.   Had testing recently with PCP- found out she is highly allergic to eggs which she loves.    Plan:  Recommend journaling of all foods and/or "elimination diet".     3. Knee pain, unspecified chronicity, unspecified laterality Increased pain recently. Had steroid injection with Dr. .  Had MRI recently which showed a Baker's cyst, no other issues   Plan:  Activity as tolerated and as per Ortho/ Sprts med.  Knee/leg strengthening exercises reviewed with her today and rec for strengthening.  Consider PT as well.   4. At risk for malnutrition Betty Price was given extensive malnutrition prevention education and counseling today of more than 9 minutes.  She is at risk due to food allergies and avoiding certain foods.  Counseled her that malnutrition refers to inappropriate nutrients or not the right balance of nutrients for optimal health.  Discussed with Betty Price that it is absolutely possible to be malnourished but yet obese.  Risk factors, including but not limited to, inappropriate dietary choices, difficulty with obtaining food due to physical or financial limitations, and various physical and mental health conditions were reviewed with Betty Price.    5. Class 1 obesity with serious comorbidity and body mass index (BMI) of 30.0 to 30.9 in adult, unspecified obesity type  Course: Betty Price is currently in the action stage of change. As such, her goal  is to continue with weight loss efforts.   Nutrition goals: She has agreed to the Category 1 Plan.   Exercise goals:  As is.  Behavioral modification strategies: planning for success.  Betty Price has agreed to follow-up with our clinic in 2 weeks.  She was informed of the importance of frequent follow-up visits to maximize her success with intensive lifestyle modifications for her multiple health conditions.   Objective:   Blood pressure 123/73, pulse (!) 59, temperature 97.8 F (36.6 C), height 5\' 4"  (1.626 m), weight 168 lb (76.2 kg), SpO2 97  %. Body mass index is 28.84 kg/m.  General: Cooperative, alert, well developed, in no acute distress. HEENT: Conjunctivae and lids unremarkable. Cardiovascular: Regular rhythm.  Lungs: Normal work of breathing. Neurologic: No focal deficits.   Lab Results  Component Value Date   CREATININE 0.91 10/21/2019   BUN 18 10/21/2019   NA 140 10/21/2019   K 4.8 10/21/2019   CL 104 10/21/2019   CO2 22 10/21/2019   Lab Results  Component Value Date   ALT 21 10/21/2019   AST 20 10/21/2019   ALKPHOS 92 10/21/2019   BILITOT 0.5 10/21/2019   Lab Results  Component Value Date   HGBA1C 5.6 07/01/2020   HGBA1C 5.6 10/21/2019   HGBA1C 5.4 11/06/2017   HGBA1C 5.1 09/04/2016   HGBA1C 5.4 07/23/2015   Lab Results  Component Value Date   INSULIN 7.2 07/01/2020   INSULIN 9.7 10/21/2019   Lab Results  Component Value Date   TSH 1.060 10/21/2019   Lab Results  Component Value Date   CHOL 241 (H) 07/01/2020   HDL 88 07/01/2020   LDLCALC 142 (H) 07/01/2020   TRIG 68 07/01/2020   CHOLHDL 2.7 07/01/2020   Lab Results  Component Value Date   WBC 3.7 10/21/2019   HGB 13.4 10/21/2019   HCT 39.0 10/21/2019   MCV 94 10/21/2019   PLT 245 10/21/2019   Attestation Statements:   Reviewed by clinician on day of visit: allergies, medications, problem list, medical history, surgical history, family history, social history, and previous encounter notes.  I, 10/23/2019, CMA, am acting as Insurance claims handler for Energy manager, DO.  I have reviewed the above documentation for accuracy and completeness, and I agree with the above. [employer], D.O.  The 21st Century Cures Act was signed into law in 2016 which includes the topic of electronic health records.  This provides immediate access to information in MyChart.  This includes consultation notes, operative notes, office notes, lab results and pathology reports.  If you have any questions about what you read please let 2017 know at your  next visit so we can discuss your concerns and take corrective action if need be.  We are right here with you.

## 2020-10-27 ENCOUNTER — Other Ambulatory Visit: Payer: Self-pay

## 2020-10-27 ENCOUNTER — Encounter (INDEPENDENT_AMBULATORY_CARE_PROVIDER_SITE_OTHER): Payer: Self-pay | Admitting: Family Medicine

## 2020-10-27 ENCOUNTER — Ambulatory Visit (INDEPENDENT_AMBULATORY_CARE_PROVIDER_SITE_OTHER): Payer: Managed Care, Other (non HMO) | Admitting: Family Medicine

## 2020-10-27 VITALS — BP 118/78 | HR 77 | Temp 98.0°F | Ht 64.0 in | Wt 169.0 lb

## 2020-10-27 DIAGNOSIS — Z9189 Other specified personal risk factors, not elsewhere classified: Secondary | ICD-10-CM | POA: Diagnosis not present

## 2020-10-27 DIAGNOSIS — E669 Obesity, unspecified: Secondary | ICD-10-CM

## 2020-10-27 DIAGNOSIS — Z6830 Body mass index (BMI) 30.0-30.9, adult: Secondary | ICD-10-CM

## 2020-10-27 DIAGNOSIS — E559 Vitamin D deficiency, unspecified: Secondary | ICD-10-CM | POA: Diagnosis not present

## 2020-10-27 MED ORDER — VITAMIN D (ERGOCALCIFEROL) 1.25 MG (50000 UNIT) PO CAPS
50000.0000 [IU] | ORAL_CAPSULE | ORAL | 0 refills | Status: DC
Start: 2020-10-27 — End: 2021-01-31

## 2020-11-08 NOTE — Progress Notes (Signed)
Chief Complaint:   OBESITY [facility] is here to discuss her progress with her obesity treatment plan along with follow-up of her obesity related diagnoses.   Today's visit was #: 15 Starting weight: 179 lbs Starting date: 06/09/2020 Today's weight: 169 lbs Today's date: 10/27/2020 Weight change since last visit: +1 lb Total lbs lost to date: 10 lbs Body mass index is 29.01 kg/m.  Total weight loss percentage to date: -5.59%  Interim History:   - Betty Price is going to the gym twice a week and playing tennis 2 days a week.  The meal plan is going well.   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 She is bummed b/c she loves eggs and eats a lot of them  Plan:  Goal is to play tennis for 2 hours 2 days per week and to go to the gym for 3 days per week (lifting weights).  Current Meal Plan: the Category 1 Plan for 70% of the time.   Current Exercise Plan: Playing tennis for 120 minutes 1 time per week.   Assessment/Plan:   Medications Discontinued During This Encounter  Medication Reason   Vitamin D, Ergocalciferol, (DRISDOL) 1.25 MG (50000 UNIT) CAPS capsule Reorder    Meds ordered this encounter  Medications   Vitamin D, Ergocalciferol, (DRISDOL) 1.25 MG (50000 UNIT) CAPS capsule    Sig: Take 1 capsule (50,000 Units total) by mouth every 7 (seven) days.    Dispense:  4 capsule    Refill:  0     1. Vitamin D deficiency Not at goal.  She is taking vitamin D 50,000 IU weekly. No issues or concerns with med  Plan: - Reiterated importance of vitamin D (as well as calcium) to their health and wellbeing.  - weight loss will likely improve availability of vitamin D, thus encouraged pt to continue with meal plan and their weight loss efforts to further improve this condition. - I recommend patient continue to take weekly prescription vit D 50,000 IU - Informed patient this may be a lifelong thing,  and she was encouraged to continue to take the medicine until told otherwise.   - we will need to monitor levels regularly (every 3-4 mo on average) to keep levels within normal limits.  - weight loss will likely improve availability of vitamin D, thus encouraged Tamyka to continue with meal plan and their weight loss efforts to further improve this condition - pt's questions and concerns regarding this condition addressed.   Lab Results  Component Value Date   VD25OH 44.7 07/01/2020   VD25OH 47.1 10/21/2019   VD25OH 33 07/21/2014   - Refill Vitamin D, Ergocalciferol, (DRISDOL) 1.25 MG (50000 UNIT) CAPS capsule; Take 1 capsule (50,000 Units total) by mouth every 7 (seven) days.  Dispense: 4 capsule; Refill: 0  2. At risk for malnutrition Oscar was given extensive malnutrition prevention education and counseling today of more than 9 minutes.  Counseled her that malnutrition refers to inappropriate nutrients or not the right balance of nutrients for optimal health.  Discussed with Betty Price that it is absolutely possible to be malnourished but yet obese or overweight.  Risk factors, including but not limited to, inappropriate dietary choices, difficulty with obtaining food due to physical or financial limitations, and various physical and mental health conditions   3. Obesity with a currrent BMI of 29.01  Course: [name] is currently in the  action stage of change. As such, her goal is to continue with weight loss efforts.   Nutrition goals: She has agreed to CHANGE TO:  keeping a food journal and adhering to recommended goals of 1000 calories and 75-80 grams of protein.   Exercise goals:  See above.  Behavioral modification strategies: increasing lean protein intake, decreasing simple carbohydrates, and planning for success.  [name] has agreed to follow-up with our clinic in 2 weeks. She was informed of the importance of frequent follow-up visits to maximize her success with intensive lifestyle  modifications for her multiple health conditions.   Objective:   Blood pressure 118/78, pulse 77, temperature 98 F (36.7 C), height 5\' 4"  (1.626 m), weight 169 lb (76.7 kg), SpO2 98 %. Body mass index is 29.01 kg/m.  General: Cooperative, alert, well developed, in no acute distress. HEENT: Conjunctivae and lids unremarkable. Cardiovascular: Regular rhythm.  Lungs: Normal work of breathing. Neurologic: No focal deficits.   Lab Results  Component Value Date   CREATININE 0.91 10/21/2019   BUN 18 10/21/2019   NA 140 10/21/2019   K 4.8 10/21/2019   CL 104 10/21/2019   CO2 22 10/21/2019   Lab Results  Component Value Date   ALT 21 10/21/2019   AST 20 10/21/2019   ALKPHOS 92 10/21/2019   BILITOT 0.5 10/21/2019   Lab Results  Component Value Date   HGBA1C 5.6 07/01/2020   HGBA1C 5.6 10/21/2019   HGBA1C 5.4 11/06/2017   HGBA1C 5.1 09/04/2016   HGBA1C 5.4 07/23/2015   Lab Results  Component Value Date   INSULIN 7.2 07/01/2020   INSULIN 9.7 10/21/2019   Lab Results  Component Value Date   TSH 1.060 10/21/2019   Lab Results  Component Value Date   CHOL 241 (H) 07/01/2020   HDL 88 07/01/2020   LDLCALC 142 (H) 07/01/2020   TRIG 68 07/01/2020   CHOLHDL 2.7 07/01/2020   Lab Results  Component Value Date   VD25OH 44.7 07/01/2020   VD25OH 47.1 10/21/2019   VD25OH 33 07/21/2014   Lab Results  Component Value Date   WBC 3.7 10/21/2019   HGB 13.4 10/21/2019   HCT 39.0 10/21/2019   MCV 94 10/21/2019   PLT 245 10/21/2019   Attestation Statements:   Reviewed by clinician on day of visit: allergies, medications, problem list, medical history, surgical history, family history, social history, and previous encounter notes.  I, 10/23/2019, CMA, am acting as Insurance claims handler for Energy manager, DO.  I have reviewed the above documentation for accuracy and completeness, and I agree with the above. [employer], D.O.  The 21st Century Cures Act was signed  into law in 2016 which includes the topic of electronic health records.  This provides immediate access to information in MyChart.  This includes consultation notes, operative notes, office notes, lab results and pathology reports.  If you have any questions about what you read please let 2017 know at your next visit so we can discuss your concerns and take corrective action if need be.  We are right here with you.

## 2020-11-11 ENCOUNTER — Ambulatory Visit (INDEPENDENT_AMBULATORY_CARE_PROVIDER_SITE_OTHER): Payer: Managed Care, Other (non HMO) | Admitting: Family Medicine

## 2020-11-25 ENCOUNTER — Ambulatory Visit (INDEPENDENT_AMBULATORY_CARE_PROVIDER_SITE_OTHER): Payer: Managed Care, Other (non HMO) | Admitting: Family Medicine

## 2020-12-09 ENCOUNTER — Ambulatory Visit: Payer: Managed Care, Other (non HMO) | Admitting: Family Medicine

## 2021-01-31 ENCOUNTER — Other Ambulatory Visit: Payer: Self-pay

## 2021-01-31 ENCOUNTER — Encounter (INDEPENDENT_AMBULATORY_CARE_PROVIDER_SITE_OTHER): Payer: Self-pay | Admitting: Family Medicine

## 2021-01-31 ENCOUNTER — Telehealth (INDEPENDENT_AMBULATORY_CARE_PROVIDER_SITE_OTHER): Payer: Managed Care, Other (non HMO) | Admitting: Family Medicine

## 2021-01-31 DIAGNOSIS — E559 Vitamin D deficiency, unspecified: Secondary | ICD-10-CM | POA: Diagnosis not present

## 2021-01-31 DIAGNOSIS — Z6830 Body mass index (BMI) 30.0-30.9, adult: Secondary | ICD-10-CM | POA: Diagnosis not present

## 2021-01-31 DIAGNOSIS — E8881 Metabolic syndrome: Secondary | ICD-10-CM | POA: Diagnosis not present

## 2021-01-31 DIAGNOSIS — Z9189 Other specified personal risk factors, not elsewhere classified: Secondary | ICD-10-CM

## 2021-01-31 DIAGNOSIS — E669 Obesity, unspecified: Secondary | ICD-10-CM | POA: Diagnosis not present

## 2021-01-31 MED ORDER — VITAMIN D (ERGOCALCIFEROL) 1.25 MG (50000 UNIT) PO CAPS
50000.0000 [IU] | ORAL_CAPSULE | ORAL | 0 refills | Status: DC
Start: 2021-01-31 — End: 2021-05-19

## 2021-02-01 NOTE — Progress Notes (Signed)
TeleHealth Visit:  Due to the COVID-19 pandemic, this visit was completed with telemedicine (audio/video) technology to reduce patient and provider exposure as well as to preserve personal protective equipment.   Betty Price has verbally consented to this TeleHealth visit. The patient is located at home, the provider is located at the [facility] office. The participants in this visit include the listed provider and patient. The visit was conducted today via video.   Chief Complaint: OBESITY Betty Price is here to discuss her progress with her obesity treatment plan along with follow-up of her obesity related diagnoses. Betty Price is on keeping a food journal and adhering to recommended goals of 1000 calories and 75-80 grams protein and states she is following her eating plan approximately 50% of the time. Betty Price states she is playing tennis and walking 90 minutes 1-5 times per week.  Today's visit was #: 16 Starting weight: 179 lbs Starting date: 06/09/2020  Interim History:  Betty Price is doing a live video visit today due to her husband being positive for COVID and currently sick.     She is working a ton and sometimes doesn't stop until 11 PM. She had to stop coming to see [name] for a while in order to prioritize work and family.  Subjective:   1. Vitamin D deficiency She is currently taking prescription vitamin D 50,000 IU each week and OTC Vit D 2,000 IU QD. She denies nausea, vomiting or muscle weakness.  Lab Results  Component Value Date   VD25OH 44.7 07/01/2020   VD25OH 47.1 10/21/2019   VD25OH 33 07/21/2014   2. Insulin resistance Betty Price's last fasting insulin came down but is still elevated above goal.   3. At risk for malnutrition Betty Price is at risk for malnutrition due to poor self-care and not making her diet/wellbeing a priority lately.  Assessment/Plan:  No orders of the defined types were placed in this encounter.   Medications Discontinued During This Encounter  Medication  Reason   Vitamin D, Ergocalciferol, (DRISDOL) 1.25 MG (50000 UNIT) CAPS capsule Reorder     Meds ordered this encounter  Medications   Vitamin D, Ergocalciferol, (DRISDOL) 1.25 MG (50000 UNIT) CAPS capsule    Sig: Take 1 capsule (50,000 Units total) by mouth every 7 (seven) days.    Dispense:  4 capsule    Refill:  0     1. Vitamin D deficiency Low Vitamin D level contributes to fatigue and are associated with obesity, breast, and colon cancer. She agrees to continue to take prescription Vitamin D 50,000 IU every week and OTC Vit D 2,000 IU QD. She will follow-up for routine testing of Vitamin D, at least 2-3 times per year to avoid over-replacement.  Refill- Vitamin D, Ergocalciferol, (DRISDOL) 1.25 MG (50000 UNIT) CAPS capsule; Take 1 capsule (50,000 Units total) by mouth every 7 (seven) days.  Dispense: 4 capsule; Refill: 0  2. Insulin resistance Betty Price will need labs rechecked sometime in the near future. We can consider meds to help aid with weight loss in the future prn. She will continue to work on weight loss, exercise, and decreasing simple carbohydrates to help decrease the risk of diabetes. Betty Price agreed to follow-up with [name] as directed to closely monitor her progress.  3. At risk for malnutrition Betty Price was given counseling today regarding ways to meet macronutrient goals.  Pt given malnutrition prevention education and counseling today of more than 8 minutes. Counseled her that malnutrition refers to inappropriate nutrients or not the right  balance of nutrients for optimal health.  Discussed with Betty Price that it is absolutely possible to be malnourished but yet obese.  Risk factors, including but not limited to, inappropriate dietary choices, difficulty with obtaining food due to physical or financial limitations etc.   4. Obesity with current BMI of 29.01 Betty Price is currently in the action stage of change. As such, her goal is to continue with weight loss efforts. She has agreed to  keeping a food journal and adhering to recommended goals of 1000 calories and 75-80 grams protein.   Goal: Go to sleep at 10:30 PM and wake up at 5:30 AM. Walk 3-4 days a week for 30-50 minutes and continue to play tennis.  Exercise goals:  As is  Behavioral modification strategies: increasing lean protein intake, decreasing simple carbohydrates, decreasing alcohol intake, and avoiding temptations.  Betty Price has agreed to follow-up with our clinic in 3-4 weeks (come fasting for repeat IC, 30 minutes prior) October 31st at 8:20 AM- Betty Price will call pt to make appt). She was informed of the importance of frequent follow-up visits to maximize her success with intensive lifestyle modifications for her multiple health conditions.  Objective:   VITALS: Per patient if applicable, see vitals. GENERAL: Alert and in no acute distress. CARDIOPULMONARY: No increased WOB. Speaking in clear sentences.  PSYCH: Pleasant and cooperative. Speech normal rate and rhythm. Affect is appropriate. Insight and judgement are appropriate. Attention is focused, linear, and appropriate.  NEURO: Oriented as arrived to appointment on time with no prompting.   Lab Results  Component Value Date   CREATININE 0.91 10/21/2019   BUN 18 10/21/2019   NA 140 10/21/2019   K 4.8 10/21/2019   CL 104 10/21/2019   CO2 22 10/21/2019   Lab Results  Component Value Date   ALT 21 10/21/2019   AST 20 10/21/2019   ALKPHOS 92 10/21/2019   BILITOT 0.5 10/21/2019   Lab Results  Component Value Date   HGBA1C 5.6 07/01/2020   HGBA1C 5.6 10/21/2019   HGBA1C 5.4 11/06/2017   HGBA1C 5.1 09/04/2016   HGBA1C 5.4 07/23/2015   Lab Results  Component Value Date   INSULIN 7.2 07/01/2020   INSULIN 9.7 10/21/2019   Lab Results  Component Value Date   TSH 1.060 10/21/2019   Lab Results  Component Value Date   CHOL 241 (H) 07/01/2020   HDL 88 07/01/2020   LDLCALC 142 (H) 07/01/2020   TRIG 68 07/01/2020   CHOLHDL 2.7 07/01/2020    Lab Results  Component Value Date   VD25OH 44.7 07/01/2020   VD25OH 47.1 10/21/2019   VD25OH 33 07/21/2014   Lab Results  Component Value Date   WBC 3.7 10/21/2019   HGB 13.4 10/21/2019   HCT 39.0 10/21/2019   MCV 94 10/21/2019   PLT 245 10/21/2019   Attestation Statements:   Reviewed by clinician on day of visit: allergies, medications, problem list, medical history, surgical history, family history, social history, and previous encounter notes.  Edmund Hilda, CMA, am acting as transcriptionist for [employer], DO.  I have reviewed the above documentation for accuracy and completeness, and I agree with the above. Carlye Grippe, D.O.  The 21st Century Cures Act was signed into law in 2016 which includes the topic of electronic health records.  This provides immediate access to information in MyChart.  This includes consultation notes, operative notes, office notes, lab results and pathology reports.  If you have any questions about what  you read please let us know at your next visit so we can discuss your concerns and take corrective action if need be.  We are right here with you.

## 2021-03-15 ENCOUNTER — Ambulatory Visit (INDEPENDENT_AMBULATORY_CARE_PROVIDER_SITE_OTHER): Payer: Managed Care, Other (non HMO) | Admitting: Family Medicine

## 2021-03-15 ENCOUNTER — Other Ambulatory Visit: Payer: Self-pay

## 2021-03-15 ENCOUNTER — Encounter: Payer: Self-pay | Admitting: Family Medicine

## 2021-03-15 ENCOUNTER — Ambulatory Visit: Payer: Self-pay

## 2021-03-15 VITALS — BP 116/72 | HR 58 | Ht 64.0 in | Wt 182.0 lb

## 2021-03-15 DIAGNOSIS — M25562 Pain in left knee: Secondary | ICD-10-CM

## 2021-03-15 NOTE — Patient Instructions (Signed)
Baker's cyst ruptured Knee compression Elevate If increased redness seek medication attention See me again in 6 weeks Remember no dishes or yardwork for next year

## 2021-03-15 NOTE — Progress Notes (Signed)
[facility] [address] [place] [address] Phone: [phone] Subjective:    I'm seeing this patient by the request  of:  Lewis Moccasin, MD  CC:   [id number]  09/30/2020 Patient had the injection and for 3 days was feeling significantly better then had an audible pop and now some potential instability of the knee.  Patient has failed conservative therapy including home exercises, physical therapy and injection with now patient having the locking and instability.  Do feel advanced imaging is warranted at this time  Updated 03/15/2021 Betty Price is a 55 y.o. female coming in with complaint of left knee pain  Onset-  Location Duration-  Character- Aggravating factors- Reliving factors-  Therapies tried-  Severity-     Past Medical History:  Diagnosis Date   Acute lateral meniscus tear of left knee    Allergy    Back pain    Emotional stress    Food allergy    Gluten free diet    Hives 2012   SAW DR. HICKS   Joint pain    Past Surgical History:  Procedure Laterality Date   GYNECOLOGIC CRYOSURGERY  05/08/2006   cin 1   lasix surgery     MENISCUS REPAIR  10/2009   in 2014 left knee   Social History   Socioeconomic History   Marital status: Married    Spouse name: Not on file   Number of children: Not on file   Years of education: Not on file   Highest education level: Not on file  Occupational History   Occupation: Haematologist  Tobacco Use   Smoking status: Never   Smokeless tobacco: Never  Vaping Use   Vaping Use: Never used  Substance and Sexual Activity   Alcohol use: Yes    Alcohol/week: 0.0 standard drinks    Comment: occassional   Drug use: No   Sexual activity: Yes    Comment: INTERCOURSE AGE 73, SEXUAL PARTNERS MORE THAN 5  Other Topics Concern   Not on file  Social History Narrative   Not on file   Social Determinants of Health   Financial Resource Strain: Not on file  Food  Insecurity: Not on file  Transportation Needs: Not on file  Physical Activity: Not on file  Stress: Not on file  Social Connections: Not on file   No Known Allergies Family History  Problem Relation Age of Onset   Heart disease Father        died of MI   Hypertension Father    Allergic rhinitis Father    Sudden death Father    Diabetes Maternal Grandmother    Allergic rhinitis Mother    Obesity Mother    Colon cancer Neg Hx    Esophageal cancer Neg Hx    Pancreatic cancer Neg Hx    Rectal cancer Neg Hx    Stomach cancer Neg Hx     Current Outpatient Medications (Endocrine & Metabolic):    NP THYROID 30 MG tablet, Take 30 mg by mouth every morning.   PROGESTERONE PO, 100 mg.      Current Outpatient Medications (Other):    Cholecalciferol (VITAMIN D) 50 MCG (2000 UT) tablet, Take 1 tablet (2,000 Units total) by mouth daily.   MAGNESIUM GLYCINATE PO, Take 100 mg by mouth as needed.   traZODone (DESYREL) 50 MG tablet, 1-2 po q h prn sleep (Patient taking differently: 50 mg at bedtime as needed. 1-2  po q h prn sleep)   Vitamin D, Ergocalciferol, (DRISDOL) 1.25 MG (50000 UNIT) CAPS capsule, Take 1 capsule (50,000 Units total) by mouth every 7 (seven) days.   Reviewed prior external information including notes and imaging from  primary care provider As well as notes that were available from care everywhere and other healthcare systems.  Past medical history, social, surgical and family history all reviewed in electronic medical record.  No pertanent information unless stated regarding to the chief complaint.   Review of Systems:  No headache, visual changes, nausea, vomiting, diarrhea, constipation, dizziness, abdominal pain, skin rash, fevers, chills, night sweats, weight loss, swollen lymph nodes, body aches, joint swelling, chest pain, shortness of breath, mood changes. POSITIVE muscle aches  Objective  There were no vitals taken for this visit.   General: No apparent  distress alert and oriented x3 mood and affect normal, dressed appropriately.  HEENT: Pupils equal, extraocular movements intact  Respiratory: Patient's speak in full sentences and does not appear short of breath  Cardiovascular: No lower extremity edema, non tender, no erythema  Gait normal with good balance and coordination.  MSK:  Non tender with full range of motion and good stability and symmetric strength and tone of shoulders, elbows, wrist, hip, knee and ankles bilaterally.     Impression and Recommendations:     The above documentation has been reviewed and is accurate and complete Doristine Bosworth

## 2021-03-15 NOTE — Progress Notes (Signed)
[facility] [address] [place] [address] Phone: [phone] Subjective:   Bruce Donath, am serving as a scribe for Dr. Antoine Primas.  This visit occurred during the SARS-CoV-2 public health emergency.  Safety protocols were in place, including screening questions prior to the visit, additional usage of staff PPE, and extensive cleaning of exam room while observing appropriate contact time as indicated for disinfecting solutions.    I'm seeing this patient by the request  of:  Lewis Moccasin, MD  CC: Left knee pain  [id number]  10/21/2020 Patient seems to be doing relatively okay at this point.  Patient does have left posterior horn tear noted of the medial meniscus.  Patient wants to play and does not want to have surgery until August if necessary.  Patient is doing better and I do feel that some of it was the Baker's cyst but is not having any pain on the posterior aspect.  Discussed with patient about staying active and doing home exercises as well as icing protocol.  Follow-up with me again 6 weeks and at that time can consider potentially repeat injection if needed  Update 03/16/2021 Betty Price is a 55 y.o. female coming in with complaint of L knee pain. Patient states that she re-injured the L knee performing knee extensions at the gym. Patient has been able to play tennis and continue to workout at the gym but after playing twice this weekend her knee became swollen and painful over medial aspect. Patient feels instability in her knee and pain with ascending stairs.      Past Medical History:  Diagnosis Date   Acute lateral meniscus tear of left knee    Allergy    Back pain    Emotional stress    Food allergy    Gluten free diet    Hives 2012   SAW DR. HICKS   Joint pain    Past Surgical History:  Procedure Laterality Date   GYNECOLOGIC CRYOSURGERY  05/08/2006   cin 1   lasix surgery     MENISCUS REPAIR  10/2009   in  2014 left knee   Social History   Socioeconomic History   Marital status: Married    Spouse name: Not on file   Number of children: Not on file   Years of education: Not on file   Highest education level: Not on file  Occupational History   Occupation: Haematologist  Tobacco Use   Smoking status: Never   Smokeless tobacco: Never  Vaping Use   Vaping Use: Never used  Substance and Sexual Activity   Alcohol use: Yes    Alcohol/week: 0.0 standard drinks    Comment: occassional   Drug use: No   Sexual activity: Yes    Comment: INTERCOURSE AGE 18, SEXUAL PARTNERS MORE THAN 5  Other Topics Concern   Not on file  Social History Narrative   Not on file   Social Determinants of Health   Financial Resource Strain: Not on file  Food Insecurity: Not on file  Transportation Needs: Not on file  Physical Activity: Not on file  Stress: Not on file  Social Connections: Not on file   No Known Allergies Family History  Problem Relation Age of Onset   Heart disease Father        died of MI   Hypertension Father    Allergic rhinitis Father    Sudden death Father    Diabetes  Maternal Grandmother    Allergic rhinitis Mother    Obesity Mother    Colon cancer Neg Hx    Esophageal cancer Neg Hx    Pancreatic cancer Neg Hx    Rectal cancer Neg Hx    Stomach cancer Neg Hx     Current Outpatient Medications (Endocrine & Metabolic):    NP THYROID 30 MG tablet, Take 30 mg by mouth every morning.   PROGESTERONE PO, 100 mg.      Current Outpatient Medications (Other):    Cholecalciferol (VITAMIN D) 50 MCG (2000 UT) tablet, Take 1 tablet (2,000 Units total) by mouth daily.   MAGNESIUM GLYCINATE PO, Take 100 mg by mouth as needed.   traZODone (DESYREL) 50 MG tablet, 1-2 po q h prn sleep (Patient taking differently: 50 mg at bedtime as needed. 1-2 po q h prn sleep)   Vitamin D, Ergocalciferol, (DRISDOL) 1.25 MG (50000 UNIT) CAPS capsule, Take 1 capsule (50,000 Units total)  by mouth every 7 (seven) days.   Reviewed prior external information including notes and imaging from  primary care provider As well as notes that were available from care everywhere and other healthcare systems.  Reviewed patient's MRI from earlier this year showing the meniscal tear as well as the Baker's cyst.  Past medical history, social, surgical and family history all reviewed in electronic medical record.  No pertanent information unless stated regarding to the chief complaint.   Review of Systems:  No headache, visual changes, nausea, vomiting, diarrhea, constipation, dizziness, abdominal pain, skin rash, fevers, chills, night sweats, weight loss, swollen lymph nodes, body aches,  chest pain, shortness of breath, mood changes. POSITIVE muscle aches, joint swelling  Objective  Blood pressure 116/72, pulse (!) 58, height 5\' 4"  (1.626 m), weight 182 lb (82.6 kg), SpO2 99 %.   General: No apparent distress alert and oriented x3 mood and affect normal, dressed appropriately.  HEENT: Pupils equal, extraocular movements intact  Respiratory: Patient's speak in full sentences and does not appear short of breath  Cardiovascular: No lower extremity edema, non tender, no erythema  Gait severely antalgic gait Patient's left knee does have tenderness to palpation over the medial joint line.  Effusion noted.  Lacks last 10 degrees of flexion of the knee.  Negative McMurray's no noted today.  Tenderness noted in the calf as well.  Fullness of the popliteal area mildly noted.  Limited muscular skeletal ultrasound was performed and interpreted by , M  Limited ultrasound of patient's left knee shows the patient does have a fairly large effusion noted of the patellofemoral joint with hypoechoic changes and findings consistent with a synovitis.  Patient does have the Baker's cyst noted but it does seem to be ruptured going down the calf.  Meniscus no significant acute findings  noted. Impression: Knee effusion with ruptured Baker's cyst   Impression and Recommendations:     The above documentation has been reviewed and is accurate and complete Antoine Primas, DO

## 2021-03-15 NOTE — Assessment & Plan Note (Signed)
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  Has had the medial meniscus previously but do not think that this is contributing to the pain.  Discussed with patient about elevation, compression, icing regimen.  Discussed that if any worsening pain, redness of the lower extremity to seek medical attention immediately patient did have a acute injury with extension of the knee as well as planning tenderness for much longer duration than she is used to.  Discussed with patient icing regimen and home exercises.  Increase activity slowly.  Follow-up with me again 6 weeks

## 2021-05-04 NOTE — Progress Notes (Signed)
[name] [address] [place] [address] Phone: [phone] Subjective:   Betty Price, am serving as a scribe for Dr. Antoine Primas. This visit occurred during the SARS-CoV-2 public health emergency.  Safety protocols were in place, including screening questions prior to the visit, additional usage of staff PPE, and extensive cleaning of exam room while observing appropriate contact time as indicated for disinfecting solutions.   I'm seeing this patient by the request  of:  Lewis Moccasin, MD  CC: Left knee pain follow-up  [id number]  03/15/2021 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  Has had the medial meniscus previously but do not think that this is contributing to the pain.  Discussed with patient about elevation, compression, icing regimen.  Discussed that if any worsening pain, redness of the lower extremity to seek medical attention immediately patient did have a acute injury with extension of the knee as well as planning tenderness for much longer duration than she is used to.  Discussed with patient icing regimen and home exercises.  Increase activity slowly.  Follow-up with me again 6 weeks  Updated 05/05/2021 Betty Price is a 56 y.o. female coming in with complaint of left knee pain.  Last time we did see patient patient did have a ruptured Baker's cyst.  Also known degenerative tear of the meniscus.  Patient states that knee has not gotten any better. States that her pain is worse at night with the first few steps then her pain goes away. Notes chronic swelling in knee and L calf.        Past Medical History:  Diagnosis Date   Acute lateral meniscus tear of left knee    Allergy    Back pain    Emotional stress    Food allergy    Gluten free diet    Hives 2012   SAW DR. HICKS   Joint pain    Past Surgical History:  Procedure Laterality Date   GYNECOLOGIC  CRYOSURGERY  05/08/2006   cin 1   lasix surgery     MENISCUS REPAIR  10/2009   in 2014 left knee   Social History   Socioeconomic History   Marital status: Married    Spouse name: Not on file   Number of children: Not on file   Years of education: Not on file   Highest education level: Not on file  Occupational History   Occupation: Haematologist  Tobacco Use   Smoking status: Never   Smokeless tobacco: Never  Vaping Use   Vaping Use: Never used  Substance and Sexual Activity   Alcohol use: Yes    Alcohol/week: 0.0 standard drinks    Comment: occassional   Drug use: No   Sexual activity: Yes    Comment: INTERCOURSE AGE 7, SEXUAL PARTNERS MORE THAN 5  Other Topics Concern   Not on file  Social History Narrative   Not on file   Social Determinants of Health   Financial Resource Strain: Not on file  Food Insecurity: Not on file  Transportation Needs: Not on file  Physical Activity: Not on file  Stress: Not on file  Social Connections: Not on file   No Known Allergies Family History  Problem Relation Age of Onset   Heart disease Father        died of MI   Hypertension Father    Allergic  rhinitis Father    Sudden death Father    Diabetes Maternal Grandmother    Allergic rhinitis Mother    Obesity Mother    Colon cancer Neg Hx    Esophageal cancer Neg Hx    Pancreatic cancer Neg Hx    Rectal cancer Neg Hx    Stomach cancer Neg Hx     Current Outpatient Medications (Endocrine & Metabolic):    NP THYROID 30 MG tablet, Take 30 mg by mouth every morning.   PROGESTERONE PO, 100 mg.      Current Outpatient Medications (Other):    Cholecalciferol (VITAMIN D) 50 MCG (2000 UT) tablet, Take 1 tablet (2,000 Units total) by mouth daily.   MAGNESIUM GLYCINATE PO, Take 100 mg by mouth as needed.   traZODone (DESYREL) 50 MG tablet, 1-2 po q h prn sleep (Patient taking differently: 50 mg at bedtime as needed. 1-2 po q h prn sleep)   Vitamin D,  Ergocalciferol, (DRISDOL) 1.25 MG (50000 UNIT) CAPS capsule, Take 1 capsule (50,000 Units total) by mouth every 7 (seven) days.   Reviewed prior external information including notes and imaging from  primary care provider As well as notes that were available from care everywhere and other healthcare systems.  Past medical history, social, surgical and family history all reviewed in electronic medical record.  No pertanent information unless stated regarding to the chief complaint.   Review of Systems:  No headache, visual changes, nausea, vomiting, diarrhea, constipation, dizziness, abdominal pain, skin rash, fevers, chills, night sweats, weight loss, swollen lymph nodes, body aches, joint swelling, chest pain, shortness of breath, mood changes. POSITIVE muscle aches  Objective  Blood pressure 118/76, pulse 65, height 5\' 4"  (1.626 m), weight 181 lb (82.1 kg), SpO2 98 %.   General: No apparent distress alert and oriented x3 mood and affect normal, dressed appropriately.  HEENT: Pupils equal, extraocular movements intact  Respiratory: Patient's speak in full sentences and does not appear short of breath  Cardiovascular: No lower extremity edema, non tender, no erythema  Gait normal with good balance and coordination.  MSK: Left knee exam does show the patient does have a fullness noted in the popliteal area.  Still some mild positive McMurray's.  Pain over the posterior medial aspect.  Procedure: Real-time Ultrasound Guided aspiration and injection of left knee Baker's cyst Device: GE Logiq Q7 Ultrasound guided injection is preferred based studies that show increased duration, increased effect, greater accuracy, decreased procedural pain, increased response rate, and decreased cost with ultrasound guided versus blind injection.  Verbal informed consent obtained.  Time-out conducted.  Noted no overlying erythema, induration, or other signs of local infection.  Skin prepped in a sterile  fashion.  Local anesthesia: Topical Ethyl chloride.  With sterile technique and under real time ultrasound guidance: With a 22-gauge 2 inch needle patient was injected with 4 cc of 0.5% Marcaine and aspirated 25 cc of steroid, fluid then injected and 1 cc of Kenalog 40 mg/dL. This was from a posterior approach.  Completed without difficulty  Pain immediately resolved suggesting accurate placement of the medication.  Advised to call if fevers/chills, erythema, induration, drainage, or persistent bleeding.  Impression: Technically successful ultrasound guided injection.    Impression and Recommendations:     The above documentation has been reviewed and is accurate and complete , DO

## 2021-05-05 ENCOUNTER — Other Ambulatory Visit: Payer: Self-pay

## 2021-05-05 ENCOUNTER — Encounter: Payer: Self-pay | Admitting: Family Medicine

## 2021-05-05 ENCOUNTER — Ambulatory Visit: Payer: Self-pay

## 2021-05-05 ENCOUNTER — Ambulatory Visit (INDEPENDENT_AMBULATORY_CARE_PROVIDER_SITE_OTHER): Payer: Managed Care, Other (non HMO) | Admitting: Family Medicine

## 2021-05-05 VITALS — BP 118/76 | HR 65 | Ht 64.0 in | Wt 181.0 lb

## 2021-05-05 DIAGNOSIS — M25562 Pain in left knee: Secondary | ICD-10-CM | POA: Diagnosis not present

## 2021-05-05 DIAGNOSIS — M7122 Synovial cyst of popliteal space [Baker], left knee: Secondary | ICD-10-CM

## 2021-05-05 DIAGNOSIS — G8929 Other chronic pain: Secondary | ICD-10-CM | POA: Diagnosis not present

## 2021-05-05 NOTE — Patient Instructions (Signed)
Good luck with the book Take it easy See me in 6-8 weeks

## 2021-05-05 NOTE — Assessment & Plan Note (Signed)
Aspiration done today, tolerated the procedure well, discussed icing regimen and home exercises.  Patient is still playing tennis.  Discussed although we do need to continue to monitor the meniscal tear as well.  Hopefully patient will continue to make improvement though now that he did have the aspiration of the Baker's cyst.  Follow-up with me again 6 to 8 weeks

## 2021-05-19 ENCOUNTER — Other Ambulatory Visit: Payer: Self-pay

## 2021-05-19 ENCOUNTER — Encounter (INDEPENDENT_AMBULATORY_CARE_PROVIDER_SITE_OTHER): Payer: Self-pay

## 2021-05-19 ENCOUNTER — Encounter (INDEPENDENT_AMBULATORY_CARE_PROVIDER_SITE_OTHER): Payer: Self-pay | Admitting: Family Medicine

## 2021-05-19 ENCOUNTER — Ambulatory Visit (INDEPENDENT_AMBULATORY_CARE_PROVIDER_SITE_OTHER): Payer: Managed Care, Other (non HMO) | Admitting: Family Medicine

## 2021-05-19 VITALS — BP 121/78 | HR 83 | Temp 98.1°F | Ht 64.0 in | Wt 179.0 lb

## 2021-05-19 DIAGNOSIS — R5383 Other fatigue: Secondary | ICD-10-CM

## 2021-05-19 DIAGNOSIS — E8881 Metabolic syndrome: Secondary | ICD-10-CM

## 2021-05-19 DIAGNOSIS — E669 Obesity, unspecified: Secondary | ICD-10-CM

## 2021-05-19 DIAGNOSIS — E7849 Other hyperlipidemia: Secondary | ICD-10-CM

## 2021-05-19 DIAGNOSIS — E039 Hypothyroidism, unspecified: Secondary | ICD-10-CM

## 2021-05-19 DIAGNOSIS — Z6830 Body mass index (BMI) 30.0-30.9, adult: Secondary | ICD-10-CM

## 2021-05-19 DIAGNOSIS — Z9189 Other specified personal risk factors, not elsewhere classified: Secondary | ICD-10-CM

## 2021-05-19 DIAGNOSIS — E559 Vitamin D deficiency, unspecified: Secondary | ICD-10-CM

## 2021-05-19 DIAGNOSIS — R0602 Shortness of breath: Secondary | ICD-10-CM | POA: Diagnosis not present

## 2021-05-19 MED ORDER — VITAMIN D (ERGOCALCIFEROL) 1.25 MG (50000 UNIT) PO CAPS: 50000.0000 [IU] | ORAL_CAPSULE | ORAL | 0 refills | Status: DC

## 2021-05-19 NOTE — Progress Notes (Signed)
Chief Complaint:   OBESITY Aniayah is here to discuss her progress with her obesity treatment plan along with follow-up of her obesity related diagnoses. Sani is on keeping a food journal and adhering to recommended goals of 1000 calories and 75-80 grams protein and states she is following her eating plan approximately 60% of the time. Rayna states she is walking and going to the gym 45-60 minutes 3 times per week.  Today's visit was #: 17 Starting weight: 179 lbs Starting date: 06/09/2020 Today's weight: 179 lbs Today's date: 05/19/2021 Total lbs lost to date: 0 Total lbs lost since last in-office visit: +10  Interim History: Pt's knee is much improved- had a Baker's cyst on left knee and is finally feeling back to normal for the first time in months. She wasn't on plan over the holidays and just enjoyed family and food. She is going to the gym 4 days a week.  Subjective:   1. Insulin resistance Ryann has a diagnosis of insulin resistance based on her elevated fasting insulin level >5. She continues to work on diet and exercise to decrease her risk of diabetes.   2. Vitamin D deficiency She is currently taking prescription vitamin D 50,000 IU each week. She denies nausea, vomiting or muscle weakness.  3. Other hyperlipidemia Ricarda has hyperlipidemia and has been trying to improve her cholesterol levels with intensive lifestyle modification including a low saturated fat diet, exercise and weight loss. She denies any chest pain, claudication or myalgias.  4. Hypothyroidism, unspecified type Current symptoms: none.   5. Other fatigue Reggie denies daytime somnolence and has concerns of feeling overly fatigued on occasion. This is not a constant feeling and could be related to sleep patterns and stress levels per patient.  6. SOB (shortness of breath) on exertion Antanette notes increasing shortness of breath with exercising and seems to be worsening over time with weight gain. She notes  getting out of breath sooner with activity than she used to. This has gotten worse recently. Shamya denies shortness of breath at rest or orthopnea.  7. At risk for activity intolerance Tennyson is at risk for exercise intolerance due to recent knee issues.  Assessment/Plan:   Orders Placed This Encounter  Procedures   Vitamin B12   VITAMIN D 25 Hydroxy (Vit-D Deficiency, Fractures)   TSH   T4, free   T3   Lipid panel   Insulin, random   Hemoglobin A1c   Comprehensive metabolic panel   CBC with Differential/Platelet    Medications Discontinued During This Encounter  Medication Reason   Cholecalciferol (VITAMIN D) 50 MCG (2000 UT) tablet    Vitamin D, Ergocalciferol, (DRISDOL) 1.25 MG (50000 UNIT) CAPS capsule Reorder     Meds ordered this encounter  Medications   Vitamin D, Ergocalciferol, (DRISDOL) 1.25 MG (50000 UNIT) CAPS capsule    Sig: Take 1 capsule (50,000 Units total) by mouth every 7 (seven) days.    Dispense:  4 capsule    Refill:  0    counselor     1. Insulin resistance Kaelee will continue to work on weight loss, exercise, and decreasing simple carbohydrates to help decrease the risk of diabetes. Arelia agreed to follow-up with [name] as directed to closely monitor her progress. Check labs today.  - Insulin, random - Hemoglobin A1c  2. Vitamin D deficiency Low Vitamin D level contributes to fatigue and are associated with obesity, breast, and colon cancer. She agrees to continue to take prescription Vitamin  D 50,000 IU every week and will follow-up for routine testing of Vitamin D, at least 2-3 times per year to avoid over-replacement. Check labs today.  Refill- Vitamin D, Ergocalciferol, (DRISDOL) 1.25 MG (50000 UNIT) CAPS capsule; Take 1 capsule (50,000 Units total) by mouth every 7 (seven) days.  Dispense: 4 capsule; Refill: 0  - VITAMIN D 25 Hydroxy (Vit-D Deficiency, Fractures)  3. Other hyperlipidemia Cardiovascular risk and specific lipid/LDL goals reviewed.   We discussed several lifestyle modifications today and Daphyne will continue to work on diet, exercise and weight loss efforts. Orders and follow up as documented in patient record. Continue NP Thyroid at current dose, for now.  Counseling Intensive lifestyle modifications are the first line treatment for this issue. Dietary changes: Increase soluble fiber. Decrease simple carbohydrates. Exercise changes: Moderate to vigorous-intensity aerobic activity 150 minutes per week if tolerated. Lipid-lowering medications: see documented in medical record. Check labs today.  - Lipid panel - Comprehensive metabolic panel  4. Hypothyroidism, unspecified type Patient with long-standing hypothyroidism, on levothyroxine therapy. She appears euthyroid. Orders and follow up as documented in patient record.  Counseling Good thyroid control is important for overall health. Supratherapeutic thyroid levels are dangerous and will not improve weight loss results. The correct way to take levothyroxine is fasting, with water, separated by at least 30 minutes from breakfast, and separated by more than 4 hours from calcium, iron, multivitamins, acid reflux medications (PPIs).  Check labs today.  - TSH - T4, free - T3  5. Other fatigue Amillya does feel that her weight is causing her energy to be lower than it should be. Fatigue may be related to obesity, depression or many other causes. Labs will be ordered, and in the meanwhile, Ellissa will focus on self care including making healthy food choices, increasing physical activity and focusing on stress reduction. Check labs today.  - Vitamin B12 - CBC with Differential/Platelet  6. SOB (shortness of breath) on exertion Niambi does feel that she gets out of breath more easily that she used to when she exercises. Indyah's shortness of breath appears to be obesity related and exercise induced. She has agreed to work on weight loss and gradually increase exercise to treat her  exercise induced shortness of breath. Will continue to monitor closely. Repeat IC today.  7. At risk for activity intolerance Maylynn was given approximately 9 minutes of exercise intolerance counseling today. She is 56 y.o. female and has risk factors exercise intolerance including obesity. We discussed intensive lifestyle modifications today with an emphasis on specific weight loss instructions and strategies. Lashon will slowly increase activity as tolerated.  Repetitive spaced learning was employed today to elicit superior memory formation and behavioral change.   8. Obesity with current BMI of 30.7  Kyilee is currently in the action stage of change. As such, her goal is to continue with weight loss efforts. She has agreed to keeping a food journal and adhering to recommended goals of 1000 calories and 80+ grams protein.   Repeat IC today shows little change from prior.   Exercise goals:  As is- slowly return to exercise.  Behavioral modification strategies: increasing lean protein intake, decreasing simple carbohydrates, planning for success, and keeping a strict food journal.  Kamri has agreed to follow-up with our clinic in 2-3 weeks. She was informed of the importance of frequent follow-up visits to maximize her success with intensive lifestyle modifications for her multiple health conditions.   Miryah was informed we would discuss her lab results  at her next visit unless there is a critical issue that needs to be addressed sooner. Noreene LarssonJill agreed to keep her next visit at the agreed upon time to discuss these results.  Objective:   Blood pressure 121/78, pulse 83, temperature 98.1 F (36.7 C), height 5\' 4"  (1.626 m), weight 179 lb (81.2 kg), SpO2 96 %. Body mass index is 30.73 kg/m.  General: Cooperative, alert, well developed, in no acute distress. HEENT: Conjunctivae and lids unremarkable. Cardiovascular: Regular rhythm.  Lungs: Normal work of breathing. Neurologic: No focal deficits.    Lab Results  Component Value Date   CREATININE 0.91 10/21/2019   BUN 18 10/21/2019   NA 140 10/21/2019   K 4.8 10/21/2019   CL 104 10/21/2019   CO2 22 10/21/2019   Lab Results  Component Value Date   ALT 21 10/21/2019   AST 20 10/21/2019   ALKPHOS 92 10/21/2019   BILITOT 0.5 10/21/2019   Lab Results  Component Value Date   HGBA1C 5.6 07/01/2020   HGBA1C 5.6 10/21/2019   HGBA1C 5.4 11/06/2017   HGBA1C 5.1 09/04/2016   HGBA1C 5.4 07/23/2015   Lab Results  Component Value Date   INSULIN 7.2 07/01/2020   INSULIN 9.7 10/21/2019   Lab Results  Component Value Date   TSH 1.060 10/21/2019   Lab Results  Component Value Date   CHOL 241 (H) 07/01/2020   HDL 88 07/01/2020   LDLCALC 142 (H) 07/01/2020   TRIG 68 07/01/2020   CHOLHDL 2.7 07/01/2020   Lab Results  Component Value Date   VD25OH 44.7 07/01/2020   VD25OH 47.1 10/21/2019   VD25OH 33 07/21/2014   Lab Results  Component Value Date   WBC 3.7 10/21/2019   HGB 13.4 10/21/2019   HCT 39.0 10/21/2019   MCV 94 10/21/2019   PLT 245 10/21/2019    Attestation Statements:   Reviewed by clinician on day of visit: allergies, medications, problem list, medical history, surgical history, family history, social history, and previous encounter notes.  Edmund HildaI, Tamesha Frazier, CMA, am acting as transcriptionist for Marsh & McLennanDeborah Coralie Stanke, DO.  I have reviewed the above documentation for accuracy and completeness, and I agree with the above. Carlye Grippe-  Candy Leverett J Braydee Shimkus, D.O.  The 21st Century Cures Act was signed into law in 2016 which includes the topic of electronic health records.  This provides immediate access to information in MyChart.  This includes consultation notes, operative notes, office notes, lab results and pathology reports.  If you have any questions about what you read please let us know at your next visit so we can discuss your concerns and take corrective action if need be.  We are right here with you.

## 2021-05-20 LAB — CBC WITH DIFFERENTIAL/PLATELET
Basophils Absolute: 0 10*3/uL (ref 0.0–0.2)
Basos: 1 %
EOS (ABSOLUTE): 0.1 10*3/uL (ref 0.0–0.4)
Eos: 2 %
Hematocrit: 43.9 % (ref 34.0–46.6)
Hemoglobin: 14.5 g/dL (ref 11.1–15.9)
Immature Grans (Abs): 0 10*3/uL (ref 0.0–0.1)
Immature Granulocytes: 0 %
Lymphocytes Absolute: 1.6 10*3/uL (ref 0.7–3.1)
Lymphs: 28 %
MCH: 32 pg (ref 26.6–33.0)
MCHC: 33 g/dL (ref 31.5–35.7)
MCV: 97 fL (ref 79–97)
Monocytes Absolute: 0.5 10*3/uL (ref 0.1–0.9)
Monocytes: 9 %
Neutrophils Absolute: 3.5 10*3/uL (ref 1.4–7.0)
Neutrophils: 60 %
Platelets: 251 10*3/uL (ref 150–450)
RBC: 4.53 x10E6/uL (ref 3.77–5.28)
RDW: 13.1 % (ref 11.7–15.4)
WBC: 5.8 10*3/uL (ref 3.4–10.8)

## 2021-05-20 LAB — COMPREHENSIVE METABOLIC PANEL
ALT: 25 IU/L (ref 0–32)
AST: 17 IU/L (ref 0–40)
Albumin/Globulin Ratio: 2.3 — ABNORMAL HIGH (ref 1.2–2.2)
Albumin: 4.6 g/dL (ref 3.8–4.9)
Alkaline Phosphatase: 71 IU/L (ref 44–121)
BUN/Creatinine Ratio: 19 (ref 9–23)
BUN: 18 mg/dL (ref 6–24)
Bilirubin Total: 0.5 mg/dL (ref 0.0–1.2)
CO2: 21 mmol/L (ref 20–29)
Calcium: 9.6 mg/dL (ref 8.7–10.2)
Chloride: 103 mmol/L (ref 96–106)
Creatinine, Ser: 0.95 mg/dL (ref 0.57–1.00)
Globulin, Total: 2 g/dL (ref 1.5–4.5)
Glucose: 100 mg/dL — ABNORMAL HIGH (ref 70–99)
Potassium: 4.4 mmol/L (ref 3.5–5.2)
Sodium: 139 mmol/L (ref 134–144)
Total Protein: 6.6 g/dL (ref 6.0–8.5)
eGFR: 71 mL/min/{1.73_m2} (ref >59)

## 2021-05-20 LAB — HEMOGLOBIN A1C
Est. average glucose Bld gHb Est-mCnc: 117 mg/dL
Hgb A1c MFr Bld: 5.7 % — ABNORMAL HIGH (ref 4.8–5.6)

## 2021-05-20 LAB — INSULIN, RANDOM: INSULIN: 8.5 u[IU]/mL (ref 2.6–24.9)

## 2021-05-20 LAB — VITAMIN B12: Vitamin B-12: 564 pg/mL (ref 232–1245)

## 2021-05-20 LAB — LIPID PANEL
Chol/HDL Ratio: 2.8 ratio (ref 0.0–4.4)
Cholesterol, Total: 247 mg/dL — ABNORMAL HIGH (ref 100–199)
HDL: 87 mg/dL (ref >39)
LDL Chol Calc (NIH): 152 mg/dL — ABNORMAL HIGH (ref 0–99)
Triglycerides: 53 mg/dL (ref 0–149)
VLDL Cholesterol Cal: 8 mg/dL (ref 5–40)

## 2021-05-20 LAB — TSH: TSH: 1.63 u[IU]/mL (ref 0.450–4.500)

## 2021-05-20 LAB — VITAMIN D 25 HYDROXY (VIT D DEFICIENCY, FRACTURES): Vit D, 25-Hydroxy: 33.8 ng/mL (ref 30.0–100.0)

## 2021-05-20 LAB — T4, FREE: Free T4: 1.35 ng/dL (ref 0.82–1.77)

## 2021-05-20 LAB — T3: T3, Total: 94 ng/dL (ref 71–180)

## 2021-06-16 NOTE — Progress Notes (Signed)
[facility] [place] Phone: [phone] Subjective:   Betty Price, am serving as a scribe for Dr. Hulan Saas.  This visit occurred during the SARS-CoV-2 public health emergency.  Safety protocols were in place, including screening questions prior to the visit, additional usage of staff PPE, and extensive cleaning of exam room while observing appropriate contact time as indicated for disinfecting solutions.   I'm seeing this patient by the request  of:  Fanny Bien, MD  CC: left knee pain   [id number]  05/05/2021 Aspiration done today, tolerated the procedure well, discussed icing regimen and home exercises.  Patient is still playing tennis.  Discussed although we do need to continue to monitor the meniscal tear as well.  Hopefully patient will continue to make improvement though now that he did have the aspiration of the Baker's cyst.  Follow-up with me again 6 to 8 weeks  Updated 06/21/2021 Betty Price is a 57 y.o. female coming in with complaint of left knee pain. Patient is not having pain over posterior aspect. Does feel fullness in back of knee. Pain is mostly over lateral aspect. Is able to play tennis and is Price worse. Using energy patch and brace prn.        Past Medical History:  Diagnosis Date   Acute lateral meniscus tear of left knee    Allergy    Back pain    Emotional stress    Food allergy    Gluten free diet    Hives 2012   SAW DR. HICKS   Joint pain    Past Surgical History:  Procedure Laterality Date   GYNECOLOGIC CRYOSURGERY  05/08/2006   cin 1   lasix surgery     MENISCUS REPAIR  10/2009   in 2014 left knee   Social History   Socioeconomic History   Marital status: Married    Spouse name: Not on file   Number of children: Not on file   Years of education: Not on file   Highest education level: Not on file  Occupational History   Occupation: Scientist, research (physical sciences)   Tobacco Use   Smoking status: Never   Smokeless tobacco: Never  Vaping Use   Vaping Use: Never used  Substance and Sexual Activity   Alcohol use: Yes    Alcohol/week: 0.0 standard drinks    Comment: occassional   Drug use: Price   Sexual activity: Yes    Comment: INTERCOURSE AGE 35, SEXUAL PARTNERS MORE THAN 5  Other Topics Concern   Not on file  Social History Narrative   Not on file   Social Determinants of Health   Financial Resource Strain: Not on file  Food Insecurity: Not on file  Transportation Needs: Not on file  Physical Activity: Not on file  Stress: Not on file  Social Connections: Not on file   Price Known Allergies Family History  Problem Relation Age of Onset   Heart disease Father        died of MI   Hypertension Father    Allergic rhinitis Father    Sudden death Father    Diabetes Maternal Grandmother    Allergic rhinitis Mother    Obesity Mother    Colon cancer Neg Hx    Esophageal cancer Neg Hx    Pancreatic cancer Neg Hx    Rectal cancer Neg Hx    Stomach cancer Neg Hx     Current  Outpatient Medications (Endocrine & Metabolic):    NP THYROID 30 MG tablet, Take 30 mg by mouth every morning.   PROGESTERONE PO, 100 mg.      Current Outpatient Medications (Other):    MAGNESIUM GLYCINATE PO, Take 100 mg by mouth as needed.   traZODone (DESYREL) 50 MG tablet, 1-2 po q h prn sleep (Patient taking differently: 50 mg at bedtime as needed. 1-2 po q h prn sleep)   Vitamin D, Ergocalciferol, (DRISDOL) 1.25 MG (50000 UNIT) CAPS capsule, Take 1 capsule (50,000 Units total) by mouth every 7 (seven) days.   Reviewed prior external information including notes and imaging from  primary care provider As well as notes that were available from care everywhere and other healthcare systems.  Past medical history, social, surgical and family history all reviewed in electronic medical record.  Price pertanent information unless stated regarding to the chief complaint.    Review of Systems:  Price headache, visual changes, nausea, vomiting, diarrhea, constipation, dizziness, abdominal pain, skin rash, fevers, chills, night sweats, weight loss, swollen lymph nodes, body aches, joint swelling, chest pain, shortness of breath, mood changes. POSITIVE muscle aches  Objective  Blood pressure 122/72, pulse 95, height 5\' 4"  (1.626 m), weight 182 lb (82.6 kg), SpO2 99 %.   General: Price apparent distress alert and oriented x3 mood and affect normal, dressed appropriately.  HEENT: Pupils equal, extraocular movements intact  Respiratory: Patient's speak in full sentences and does not appear short of breath  Cardiovascular: Price lower extremity edema, non tender, Price erythema  Gait normal with good balance and coordination.  MSK: Left knee exam shows the patient still moderately tender to palpation over the medial joint line. Patient does have mild positive McMurray's  Limited muscular skeletal ultrasound was performed and interpreted by Hulan Saas, M  Patient does have some hypoechoic changes meniscus still noted with some degenerative tearing but Price significant displacement more close to about 10% displacement.  Price reaccumulation of the Baker's cyst. Impression: Interval improvement noted.     Impression and Recommendations:     The above documentation has been reviewed and is accurate and complete Betty Pulley, DO

## 2021-06-21 ENCOUNTER — Ambulatory Visit (INDEPENDENT_AMBULATORY_CARE_PROVIDER_SITE_OTHER): Payer: Managed Care, Other (non HMO) | Admitting: Family Medicine

## 2021-06-21 ENCOUNTER — Other Ambulatory Visit: Payer: Self-pay

## 2021-06-21 ENCOUNTER — Encounter: Payer: Self-pay | Admitting: Family Medicine

## 2021-06-21 ENCOUNTER — Ambulatory Visit: Payer: Self-pay

## 2021-06-21 VITALS — BP 122/72 | HR 95 | Ht 64.0 in | Wt 182.0 lb

## 2021-06-21 DIAGNOSIS — M23204 Derangement of unspecified medial meniscus due to old tear or injury, left knee: Secondary | ICD-10-CM

## 2021-06-21 DIAGNOSIS — M25562 Pain in left knee: Secondary | ICD-10-CM | POA: Diagnosis not present

## 2021-06-21 NOTE — Patient Instructions (Signed)
Congrats on the book See me back again in 2 months [place]

## 2021-06-21 NOTE — Assessment & Plan Note (Signed)
Patient is doing better at this time. Discussed continuing the home exercises and icing regimen.  Potentially doing well with PRP if necessary.  Increase activity as tolerated.  May have a recurrence and may need to be more aggressive otherwise.  Follow-up again in 2 months if not completely improved.

## 2021-06-22 ENCOUNTER — Ambulatory Visit (INDEPENDENT_AMBULATORY_CARE_PROVIDER_SITE_OTHER): Payer: Managed Care, Other (non HMO) | Admitting: Family Medicine

## 2021-06-22 ENCOUNTER — Encounter (INDEPENDENT_AMBULATORY_CARE_PROVIDER_SITE_OTHER): Payer: Self-pay | Admitting: Family Medicine

## 2021-06-22 VITALS — BP 125/84 | HR 66 | Temp 97.8°F | Ht 64.0 in | Wt 177.0 lb

## 2021-06-22 DIAGNOSIS — E7849 Other hyperlipidemia: Secondary | ICD-10-CM | POA: Diagnosis not present

## 2021-06-22 DIAGNOSIS — E038 Other specified hypothyroidism: Secondary | ICD-10-CM | POA: Diagnosis not present

## 2021-06-22 DIAGNOSIS — E559 Vitamin D deficiency, unspecified: Secondary | ICD-10-CM

## 2021-06-22 DIAGNOSIS — E669 Obesity, unspecified: Secondary | ICD-10-CM

## 2021-06-22 DIAGNOSIS — Z6830 Body mass index (BMI) 30.0-30.9, adult: Secondary | ICD-10-CM

## 2021-06-22 DIAGNOSIS — R7303 Prediabetes: Secondary | ICD-10-CM | POA: Diagnosis not present

## 2021-06-22 DIAGNOSIS — Z9189 Other specified personal risk factors, not elsewhere classified: Secondary | ICD-10-CM

## 2021-06-22 MED ORDER — VITAMIN D (ERGOCALCIFEROL) 1.25 MG (50000 UNIT) PO CAPS: 50000.0000 [IU] | ORAL_CAPSULE | ORAL | 0 refills | Status: AC

## 2021-06-22 NOTE — Patient Instructions (Signed)
The 10-year ASCVD risk score (Arnett DK, et al., 2019) is: 1.5%   Values used to calculate the score:     Age: 56 years     Sex: Female     Is Non-Hispanic African American: No     Diabetic: No     Tobacco smoker: No     Systolic Blood Pressure: 125 mmHg     Is BP treated: No     HDL Cholesterol: 87 mg/dL     Total Cholesterol: 247 mg/dL

## 2021-06-23 NOTE — Progress Notes (Signed)
Chief Complaint:   OBESITY Betty Price is here to discuss her progress with her obesity treatment plan along with follow-up of her obesity related diagnoses. Betty Price is on keeping a food journal and adhering to recommended goals of 1000 calories and 80 GRAMS protein and states she is following her eating plan approximately 60% of the time. Betty Price states she is playing tennis and weight training at the gym 40-90 minutes 1-2 times per week.  Today's visit was #: 18 Starting weight: 179 lbs Starting date: 06/09/2020 Today's weight: 177 lbs Today's date: 06/22/2021 Total lbs lost to date: 2 Total lbs lost since last in-office visit: 2  Interim History: Betty Price's stress eating has decreased from prior. She has no issues with the plan. She did journal and it helps her be accountable, hitting her goals 50% of the time. Her knee is slowly improving and she is going to the gym. Pt will be gone the month of March, helping her mom move back to [facility]. It will be a challenge not having full control over food choices.   Subjective:   1. Pre-diabetes Discussed labs with patient today. New. Betty Price's fasting insulin is >5 and A1c is elevated. She was previously diagnosed with insulin resistance.  2. Other specified hypothyroidism Discussed labs with patient today. Pt notes improving energy levels. Medication: NP Thyroid  3. Other hyperlipidemia Worsening. Discussed labs with patient today. Pt's LDL increased from 142 to 626. Her triglycerides are within normal limits and HDL is exceptionally good at 87.    4. Vitamin D deficiency Worsening. Discussed labs with patient today. Pt is not taking Ergocalciferol as written, as she skips doses and will remember later. She takes it every 10 days or whenever she remembers.  5. At risk of diabetes mellitus Betty Price is at higher than average risk for developing diabetes due to new onset pre-diabetes.  Assessment/Plan:  No orders of the defined types were placed in this  encounter.   Medications Discontinued During This Encounter  Medication Reason   PROGESTERONE PO Patient Preference   Vitamin D, Ergocalciferol, (DRISDOL) 1.25 MG (50000 UNIT) CAPS capsule Reorder     Meds ordered this encounter  Medications   Vitamin D, Ergocalciferol, (DRISDOL) 1.25 MG (50000 UNIT) CAPS capsule    Sig: Take 1 capsule (50,000 Units total) by mouth every 7 (seven) days.    Dispense:  4 capsule    Refill:  0    counselor     1. Pre-diabetes Handouts given on pre-diabetes after extensive discussion of disease process. Decrease simple carbs and lose weight. Pt declines meds at this time. Recheck labs in 3 months.  2. Other specified hypothyroidism Thyroid panel is within normal limits. Continue current treatment plan.  3. Other hyperlipidemia Continue to increase exercise, continue prudent nutritional plan, decrease saturated and trans fats. No need for meds at this time.  The 10-year ASCVD risk score (Arnett DK, et al., 2019) is: 1.5%   Values used to calculate the score:     Age: 56 years     Sex: Female     Is Non-Hispanic African American: No     Diabetic: No     Tobacco smoker: No     Systolic Blood Pressure: 125 mmHg     Is BP treated: No     HDL Cholesterol: 87 mg/dL     Total Cholesterol: 247 mg/dL  4. Vitamin D deficiency Not at goal. Vit D level 33.8. pt should take Ergocalciferol as prescribed and  don't skip. Medication compliance strategies discussed with pt. Recheck labs in 3 months.  Refill- Vitamin D, Ergocalciferol, (DRISDOL) 1.25 MG (50000 UNIT) CAPS capsule; Take 1 capsule (50,000 Units total) by mouth every 7 (seven) days.  Dispense: 4 capsule; Refill: 0  5. At risk of diabetes mellitus Betty Price was given approximately 10 minutes of diabetic education and counseling today. We discussed intensive lifestyle modifications today with an emphasis on weight loss as well as increasing exercise and decreasing simple carbohydrates in her diet. We also  reviewed medication options with an emphasis on risk versus benefits of those discussed.  Repetitive spaced learning was employed today to elicit superior memory formation and behavioral change.  6. Obesity with current BMI of 30.4 Betty Price is currently in the action stage of change. As such, her goal is to continue with weight loss efforts. She has agreed to keeping a food journal and adhering to recommended goals of 1000 calories and 80+ grams protein.   Continue journaling and be active while away.  Exercise goals:  As is  Behavioral modification strategies: increasing lean protein intake, decreasing simple carbohydrates, travel eating strategies, and keeping a strict food journal.  Betty Price has agreed to follow-up with our clinic in 4-5 weeks. She was informed of the importance of frequent follow-up visits to maximize her success with intensive lifestyle modifications for her multiple health conditions.   Objective:   Blood pressure 125/84, pulse 66, temperature 97.8 F (36.6 C), temperature source Oral, height 5\' 4"  (1.626 m), weight 177 lb (80.3 kg), SpO2 99 %. Body mass index is 30.38 kg/m.  General: Cooperative, alert, well developed, in no acute distress. HEENT: Conjunctivae and lids unremarkable. Cardiovascular: Regular rhythm.  Lungs: Normal work of breathing. Neurologic: No focal deficits.   Lab Results  Component Value Date   CREATININE 0.95 05/19/2021   BUN 18 05/19/2021   NA 139 05/19/2021   K 4.4 05/19/2021   CL 103 05/19/2021   CO2 21 05/19/2021   Lab Results  Component Value Date   ALT 25 05/19/2021   AST 17 05/19/2021   ALKPHOS 71 05/19/2021   BILITOT 0.5 05/19/2021   Lab Results  Component Value Date   HGBA1C 5.7 (H) 05/19/2021   HGBA1C 5.6 07/01/2020   HGBA1C 5.6 10/21/2019   HGBA1C 5.4 11/06/2017   HGBA1C 5.1 09/04/2016   Lab Results  Component Value Date   INSULIN 8.5 05/19/2021   INSULIN 7.2 07/01/2020   INSULIN 9.7 10/21/2019   Lab Results   Component Value Date   TSH 1.630 05/19/2021   Lab Results  Component Value Date   CHOL 247 (H) 05/19/2021   HDL 87 05/19/2021   LDLCALC 152 (H) 05/19/2021   TRIG 53 05/19/2021   CHOLHDL 2.8 05/19/2021   Lab Results  Component Value Date   VD25OH 33.8 05/19/2021   VD25OH 44.7 07/01/2020   VD25OH 47.1 10/21/2019   Lab Results  Component Value Date   WBC 5.8 05/19/2021   HGB 14.5 05/19/2021   HCT 43.9 05/19/2021   MCV 97 05/19/2021   PLT 251 05/19/2021    Attestation Statements:   Reviewed by clinician on day of visit: allergies, medications, problem list, medical history, surgical history, family history, social history, and previous encounter notes.  05/21/2021, CMA, am acting as transcriptionist for Edmund Hilda, DO.  I have reviewed the above documentation for accuracy and completeness, and I agree with the above. [employer], D.O.  The 21st Century Cures Act  was signed into law in 2016 which includes the topic of electronic health records.  This provides immediate access to information in MyChart.  This includes consultation notes, operative notes, office notes, lab results and pathology reports.  If you have any questions about what you read please let us know at your next visit so we can discuss your concerns and take corrective action if need be.  We are right here with you.

## 2021-07-26 NOTE — Progress Notes (Signed)
?Betty Price D.O. ?[facility] ?[address] [place] [address] ?Phone: [phone] ?Subjective:   ?I, Nadine Counts, am serving as a Neurosurgeon for Dr. Antoine Primas. ?This visit occurred during the SARS-CoV-2 public health emergency.  Safety protocols were in place, including screening questions prior to the visit, additional usage of staff PPE, and extensive cleaning of exam room while observing appropriate contact time as indicated for disinfecting solutions.  ? ?I'm seeing this patient by the request  of:  Lewis Moccasin, MD ? ?CC: left knee pain  ? ?[id number]  ?06/21/2021 ?Patient is doing better at this time. ?Discussed continuing the home exercises and icing regimen.  Potentially doing well with PRP if necessary.  Increase activity as tolerated.  May have a recurrence and may need to be more aggressive otherwise.  Follow-up again in 2 months if not completely improved. ? ?Updated 07/27/2021 ?Betty Price is a 56 y.o. female coming in with complaint of left knee pain. Having some popping sensations which is new and swelling going down into calf. Ice helps with pain. Painful after sitting for 20 mins, but then works itself out. No other complaints. ? ? ? ?  ? ?Past Medical History:  ?Diagnosis Date  ? Acute lateral meniscus tear of left knee   ? Allergy   ? Back pain   ? Emotional stress   ? Food allergy   ? Gluten free diet   ? Hives 2012  ? SAW DR. HICKS  ? Joint pain   ? ?Past Surgical History:  ?Procedure Laterality Date  ? GYNECOLOGIC CRYOSURGERY  05/08/2006  ? cin 1  ? lasix surgery    ? MENISCUS REPAIR  10/2009  ? in 2014 left knee  ? ?Social History  ? ?Socioeconomic History  ? Marital status: Married  ?  Spouse name: Not on file  ? Number of children: Not on file  ? Years of education: Not on file  ? Highest education level: Not on file  ?Occupational History  ? Occupation: Haematologist  ?Tobacco Use  ? Smoking status: Never  ? Smokeless tobacco: Never  ?Vaping Use  ?  Vaping Use: Never used  ?Substance and Sexual Activity  ? Alcohol use: Yes  ?  Alcohol/week: 0.0 standard drinks  ?  Comment: occassional  ? Drug use: No  ? Sexual activity: Yes  ?  Comment: INTERCOURSE AGE 1, SEXUAL PARTNERS MORE THAN 5  ?Other Topics Concern  ? Not on file  ?Social History Narrative  ? Not on file  ? ?Social Determinants of Health  ? ?Financial Resource Strain: Not on file  ?Food Insecurity: Not on file  ?Transportation Needs: Not on file  ?Physical Activity: Not on file  ?Stress: Not on file  ?Social Connections: Not on file  ? ?No Known Allergies ?Family History  ?Problem Relation Age of Onset  ? Heart disease Father   ?     died of MI  ? Hypertension Father   ? Allergic rhinitis Father   ? Sudden death Father   ? Diabetes Maternal Grandmother   ? Allergic rhinitis Mother   ? Obesity Mother   ? Colon cancer Neg Hx   ? Esophageal cancer Neg Hx   ? Pancreatic cancer Neg Hx   ? Rectal cancer Neg Hx   ? Stomach cancer Neg Hx   ? ? ?Current Outpatient Medications (Endocrine & Metabolic):  ?  NP THYROID 30 MG tablet, Take 30 mg by mouth every  morning. ? ? ? ? ? ?Current Outpatient Medications (Other):  ?  MAGNESIUM GLYCINATE PO, Take 100 mg by mouth as needed. ?  traZODone (DESYREL) 50 MG tablet, 1-2 po q h prn sleep (Patient taking differently: 50 mg at bedtime as needed. 1-2 po q h prn sleep) ?  Vitamin D, Ergocalciferol, (DRISDOL) 1.25 MG (50000 UNIT) CAPS capsule, Take 1 capsule (50,000 Units total) by mouth every 7 (seven) days. ? ? ?Reviewed prior external information including notes and imaging from  ?primary care provider ?As well as notes that were available from care everywhere and other healthcare systems. ? ?Past medical history, social, surgical and family history all reviewed in electronic medical record.  No pertanent information unless stated regarding to the chief complaint.  ? ?Review of Systems: ? No headache, visual changes, nausea, vomiting, diarrhea, constipation, dizziness,  abdominal pain, skin rash, fevers, chills, night sweats, weight loss, swollen lymph nodes, body aches, joint swelling, chest pain, shortness of breath, mood changes. POSITIVE muscle aches ? ?Objective  ?Blood pressure 130/88, pulse 71, height 5\' 4"  (1.626 m), weight 182 lb (82.6 kg), SpO2 98 %. ?  ?General: No apparent distress alert and oriented x3 mood and affect normal, dressed appropriately.  ?HEENT: Pupils equal, extraocular movements intact  ?Respiratory: Patient's speak in full sentences and does not appear short of breath  ?Cardiovascular: No lower extremity edema, non tender, no erythema  ?Gait normal with good balance and coordination.  ?MSK: Patient does have swelling noted of the left knee.  This is the patellofemoral joint as well as the Baker's cyst again in the back.  Significant tightness noted in the area.  Lacks last 20 degrees of flexion secondary to tightness of the knee ? ?Procedure: Real-time Ultrasound Guided Injection of left knee ?Device: GE Logiq Q7 ?Ultrasound guided injection is preferred based studies that show increased duration, increased effect, greater accuracy, decreased procedural pain, increased response rate, and decreased cost with ultrasound guided versus blind injection.  ?Verbal informed consent obtained.  ?Time-out conducted.  ?Noted no overlying erythema, induration, or other signs of local infection.  ?Skin prepped in a sterile fashion.  ?Local anesthesia: Topical Ethyl chloride.  ?With sterile technique and under real time ultrasound guidance: With a 22-gauge 2 inch needle patient was injected with 4 cc of 0.5% Marcaine and aspirated 40 cc of straw-colored fluid then injected 1 cc of Kenalog 40 mg per mill from the posterior aspect. ?Completed without difficulty  ?Pain immediately resolved suggesting accurate placement of the medication.  ?Advised to call if fevers/chills, erythema, induration, drainage, or persistent bleeding.  ?Impression: Technically successful  ultrasound guided injection. ? ?  ?Impression and Recommendations:  ?  ? ?The above documentation has been reviewed and is accurate and complete , DO ? ? ? ?

## 2021-07-27 ENCOUNTER — Ambulatory Visit: Payer: Self-pay

## 2021-07-27 ENCOUNTER — Encounter: Payer: Self-pay | Admitting: Family Medicine

## 2021-07-27 ENCOUNTER — Ambulatory Visit (INDEPENDENT_AMBULATORY_CARE_PROVIDER_SITE_OTHER): Payer: Managed Care, Other (non HMO) | Admitting: Family Medicine

## 2021-07-27 VITALS — BP 130/88 | HR 71 | Ht 64.0 in | Wt 182.0 lb

## 2021-07-27 DIAGNOSIS — M23204 Derangement of unspecified medial meniscus due to old tear or injury, left knee: Secondary | ICD-10-CM

## 2021-07-27 DIAGNOSIS — M7122 Synovial cyst of popliteal space [Baker], left knee: Secondary | ICD-10-CM | POA: Diagnosis not present

## 2021-07-27 NOTE — Patient Instructions (Addendum)
Drained Cyst today ?Good to see you! ?See you again in 10-12 weeks ?

## 2021-07-27 NOTE — Assessment & Plan Note (Signed)
Patient had aspiration done today.  Reaccumulation again.  Patient's MRI does show that patient does have significant meniscal tear noted.  I believe that the reaccumulation was patient trying to splint the knee.  Patient wants to hold until after the tennis season and then will consider arthroscopic surgery.  Follow-up again in 2 to 3 months. ?

## 2021-08-16 ENCOUNTER — Ambulatory Visit: Payer: Managed Care, Other (non HMO) | Admitting: Family Medicine

## 2021-10-04 NOTE — Progress Notes (Unsigned)
Betty Price Phone: [phone] Subjective:   Betty Price, am serving as a scribe for Dr. Hulan Saas.   I'm seeing this patient by the request  of:  Fanny Bien, MD  CC: Left knee pain  [id number]  07/27/2021 Patient had aspiration done today.  Reaccumulation again.  Patient's MRI does show that patient does have significant meniscal tear noted.  I believe that the reaccumulation was patient trying to splint the knee.  Patient wants to hold until after the tennis season and then will consider arthroscopic surgery.  Follow-up again in 2 to 3 months.  Update 10/05/2021 Betty Price is a 56 y.o. female coming in with complaint of L knee pain. Patient states that she is frustrated as her pain is not improving. Able to play tennis but it pain remains for 2 days afterwards.       Past Medical History:  Diagnosis Date   Acute lateral meniscus tear of left knee    Allergy    Back pain    Emotional stress    Food allergy    Gluten free diet    Hives 2012   SAW DR. HICKS   Joint pain    Past Surgical History:  Procedure Laterality Date   GYNECOLOGIC CRYOSURGERY  05/08/2006   cin 1   lasix surgery     MENISCUS REPAIR  10/2009   in 2014 left knee   Social History   Socioeconomic History   Marital status: Married    Spouse name: Not on file   Number of children: Not on file   Years of education: Not on file   Highest education level: Not on file  Occupational History   Occupation: Scientist, research (physical sciences)  Tobacco Use   Smoking status: Never   Smokeless tobacco: Never  Vaping Use   Vaping Use: Never used  Substance and Sexual Activity   Alcohol use: Yes    Alcohol/week: 0.0 standard drinks    Comment: occassional   Drug use: Price   Sexual activity: Yes    Comment: INTERCOURSE AGE 73, SEXUAL PARTNERS MORE THAN 5  Other Topics Concern   Not on file  Social History Narrative   Not on file    Social Determinants of Health   Financial Resource Strain: Not on file  Food Insecurity: Not on file  Transportation Needs: Not on file  Physical Activity: Not on file  Stress: Not on file  Social Connections: Not on file   Price Known Allergies Family History  Problem Relation Age of Onset   Heart disease Father        died of MI   Hypertension Father    Allergic rhinitis Father    Sudden death Father    Diabetes Maternal Grandmother    Allergic rhinitis Mother    Obesity Mother    Colon cancer Neg Hx    Esophageal cancer Neg Hx    Pancreatic cancer Neg Hx    Rectal cancer Neg Hx    Stomach cancer Neg Hx     Current Outpatient Medications (Endocrine & Metabolic):    NP THYROID 30 MG tablet, Take 30 mg by mouth every morning.      Current Outpatient Medications (Other):    MAGNESIUM GLYCINATE PO, Take 100 mg by mouth as needed.   traZODone (DESYREL) 50 MG tablet, 1-2 po q h prn sleep (Patient taking differently: 50 mg at bedtime as  needed. 1-2 po q h prn sleep)   Vitamin D, Ergocalciferol, (DRISDOL) 1.25 MG (50000 UNIT) CAPS capsule, Take 1 capsule (50,000 Units total) by mouth every 7 (seven) days.    Review of Systems:  Price headache, visual changes, nausea, vomiting, diarrhea, constipation, dizziness, abdominal pain, skin rash, fevers, chills, night sweats, weight loss, swollen lymph nodes, body aches, joint swelling, chest pain, shortness of breath, mood changes. POSITIVE muscle aches  Objective  Blood pressure 124/82, pulse 75, height 5\' 4"  (1.626 m), weight 181 lb (82.1 kg), SpO2 99 %.   General: Price apparent distress alert and oriented x3 mood and affect normal, dressed appropriately.  HEENT: Pupils equal, extraocular movements intact  Respiratory: Patient's speak in full sentences and does not appear short of breath  Cardiovascular: Price lower extremity edema, non tender, Price erythema  Gait normal with good balance and coordination.  MSK: Left knee still has  some tenderness to palpation in the posterior medial aspect of the knee.  Price significant reaccumulation of the Baker's cyst palpated.  Patient lacks last 2 degrees of flexion in the last 5 degrees of extension.  Positive McMurray's.    Impression and Recommendations:     The above documentation has been reviewed and is accurate and complete Lyndal Pulley, DO

## 2021-10-05 ENCOUNTER — Ambulatory Visit (INDEPENDENT_AMBULATORY_CARE_PROVIDER_SITE_OTHER): Payer: Managed Care, Other (non HMO) | Admitting: Family Medicine

## 2021-10-05 DIAGNOSIS — M7122 Synovial cyst of popliteal space [Baker], left knee: Secondary | ICD-10-CM | POA: Diagnosis not present

## 2021-10-05 NOTE — Patient Instructions (Signed)
Good to see you Watch for reaccumulation Ice daily  Ok to work out: shorter strides, decrease ROM and twisting See me in 6 weeks

## 2021-10-05 NOTE — Assessment & Plan Note (Signed)
Discussed with patient again.  If it continues to give reaccumulation will do need to consider the possibility of surgical intervention.  Patient though at this point it feels like it is not stopping her from anything and if she was going to have surgery she would like to wait until the winter.  Patient has been able to continue to play tennis at the moment.  Discussed avoiding certain activities.  Continuing to wear the brace.  Will continue to have follow-up 6 to 8 weeks to further evaluate and if any significant accumulation again we will consider aspiration.

## 2021-11-16 ENCOUNTER — Ambulatory Visit: Payer: Managed Care, Other (non HMO) | Admitting: Family Medicine

## 2021-12-07 ENCOUNTER — Encounter (INDEPENDENT_AMBULATORY_CARE_PROVIDER_SITE_OTHER): Payer: Self-pay

## 2021-12-15 ENCOUNTER — Ambulatory Visit: Payer: Managed Care, Other (non HMO) | Admitting: Family Medicine

## 2022-01-12 NOTE — Progress Notes (Unsigned)
[name] [address] [place] [address] Phone: [phone] Subjective:   Bruce Donath, am serving as a scribe for Dr. Antoine Primas.   I'm seeing this patient by the request  of:  Lewis Moccasin, MD  CC: left knee pain   [id number]  10/05/2021 Discussed with patient again.  If it continues to give reaccumulation will do need to consider the possibility of surgical intervention.  Patient though at this point it feels like it is not stopping her from anything and if she was going to have surgery she would like to wait until the winter.  Patient has been able to continue to play tennis at the moment.  Discussed avoiding certain activities.  Continuing to wear the brace.  Will continue to have follow-up 6 to 8 weeks to further evaluate and if any significant accumulation again we will consider aspiration.  Updated 01/17/2022 Betty Price is a 56 y.o. female coming in with complaint of left knee pain. Patient has not been able to play tennis and walking will cause her to have to rest for 2 days. Did develop pain under patella for 2 weeks but that has done away. Does not feel like cyst is any bigger than last visit.        Past Medical History:  Diagnosis Date   Acute lateral meniscus tear of left knee    Allergy    Back pain    Emotional stress    Food allergy    Gluten free diet    Hives 2012   SAW DR. HICKS   Joint pain    Past Surgical History:  Procedure Laterality Date   GYNECOLOGIC CRYOSURGERY  05/08/2006   cin 1   lasix surgery     MENISCUS REPAIR  10/2009   in 2014 left knee   Social History   Socioeconomic History   Marital status: Married    Spouse name: Not on file   Number of children: Not on file   Years of education: Not on file   Highest education level: Not on file  Occupational History   Occupation: Haematologist  Tobacco Use   Smoking status: Never   Smokeless tobacco: Never  Vaping Use    Vaping Use: Never used  Substance and Sexual Activity   Alcohol use: Yes    Alcohol/week: 0.0 standard drinks of alcohol    Comment: occassional   Drug use: No   Sexual activity: Yes    Comment: INTERCOURSE AGE 61, SEXUAL PARTNERS MORE THAN 5  Other Topics Concern   Not on file  Social History Narrative   Not on file   Social Determinants of Health   Financial Resource Strain: Not on file  Food Insecurity: Not on file  Transportation Needs: Not on file  Physical Activity: Not on file  Stress: Not on file  Social Connections: Not on file   No Known Allergies Family History  Problem Relation Age of Onset   Heart disease Father        died of MI   Hypertension Father    Allergic rhinitis Father    Sudden death Father    Diabetes Maternal Grandmother    Allergic rhinitis Mother    Obesity Mother    Colon cancer Neg Hx    Esophageal cancer Neg Hx    Pancreatic cancer Neg Hx    Rectal cancer Neg Hx    Stomach cancer Neg Hx  Current Outpatient Medications (Endocrine & Metabolic):    NP THYROID 30 MG tablet, Take 30 mg by mouth every morning.      Current Outpatient Medications (Other):    MAGNESIUM GLYCINATE PO, Take 100 mg by mouth as needed.   traZODone (DESYREL) 50 MG tablet, 1-2 po q h prn sleep (Patient taking differently: 50 mg at bedtime as needed. 1-2 po q h prn sleep)   Vitamin D, Ergocalciferol, (DRISDOL) 1.25 MG (50000 UNIT) CAPS capsule, Take 1 capsule (50,000 Units total) by mouth every 7 (seven) days.   Reviewed prior external information including notes and imaging from  primary care provider As well as notes that were available from care everywhere and other healthcare systems.  Past medical history, social, surgical and family history all reviewed in electronic medical record.  No pertanent information unless stated regarding to the chief complaint.   Review of Systems:  No headache, visual changes, nausea, vomiting, diarrhea, constipation,  dizziness, abdominal pain, skin rash, fevers, chills, night sweats, weight loss, swollen lymph nodes, body aches, joint swelling, chest pain, shortness of breath, mood changes. POSITIVE muscle aches  Objective  Blood pressure 112/80, pulse 79, height 5\' 4"  (1.626 m), weight 178 lb (80.7 kg), SpO2 99 %.   General: No apparent distress alert and oriented x3 mood and affect normal, dressed appropriately.  HEENT: Pupils equal, extraocular movements intact  Respiratory: Patient's speak in full sentences and does not appear short of breath  Cardiovascular: No lower extremity edema, non tender, no erythema  Left knee exam still shows that there is tenderness to palpation over the medial joint line.  Positive McMurray's noted.  Patient does have mild fullness in the popliteal area.   Limited muscular skeletal ultrasound was performed and interpreted by , M  Limited ultrasound of patient's knee still shows that there is degenerative changes with a meniscal tear likely noted of the left knee.  Minimal displacement noted.  Patient is significantly tender to palpation in this area.  Baker's cyst is significantly smaller in size from previous exam. Impression: Improvement in Baker's cyst.   Impression and Recommendations:    The above documentation has been reviewed and is accurate and complete Antoine Primas, DO

## 2022-01-17 ENCOUNTER — Encounter: Payer: Self-pay | Admitting: Family Medicine

## 2022-01-17 ENCOUNTER — Ambulatory Visit (INDEPENDENT_AMBULATORY_CARE_PROVIDER_SITE_OTHER): Payer: Managed Care, Other (non HMO) | Admitting: Family Medicine

## 2022-01-17 ENCOUNTER — Ambulatory Visit: Payer: Self-pay

## 2022-01-17 VITALS — BP 112/80 | HR 79 | Ht 64.0 in | Wt 178.0 lb

## 2022-01-17 DIAGNOSIS — G8929 Other chronic pain: Secondary | ICD-10-CM

## 2022-01-17 DIAGNOSIS — M25562 Pain in left knee: Secondary | ICD-10-CM | POA: Diagnosis not present

## 2022-01-17 NOTE — Assessment & Plan Note (Signed)
Patient does have the meniscal tear noted.  Continuing to have some difficulty overall.  Discussed with patient about patient has failed all conservative therapy and now having worsening pain even with daily activities.  I am concerned that there is more instability secondary to the meniscus.  Patient has had 1 surgery greater than 10 years ago on that side.  I do think an arthroscopic surgery could be beneficial.  Then would consider possibly viscosupplementation at a later date.  Patient is in agreement and will be referred to orthopedic surgery.

## 2022-01-17 NOTE — Patient Instructions (Addendum)
Good to see you Referral to Dr. Rhona Raider or Berenice Primas  [phone] See me when you need me or write with questions

## 2022-01-20 ENCOUNTER — Telehealth: Payer: Self-pay

## 2022-01-20 DIAGNOSIS — M25562 Pain in left knee: Secondary | ICD-10-CM

## 2022-01-20 DIAGNOSIS — M23204 Derangement of unspecified medial meniscus due to old tear or injury, left knee: Secondary | ICD-10-CM

## 2022-01-20 NOTE — Telephone Encounter (Signed)
Patient called stating she called [facility] and they didn't have her referral I see where it was stated we would order it but it was not ordered. I have placed this order and have sent it through proficient with all patients records.

## 2022-05-12 IMAGING — DX DG KNEE COMPLETE 4+V*L*
4 series · 4 of 4 positions shown · non-contrast
Comparison: None.

CLINICAL DATA: Left knee pain

EXAM:
LEFT KNEE - COMPLETE 4+ VIEW

[knee ap]
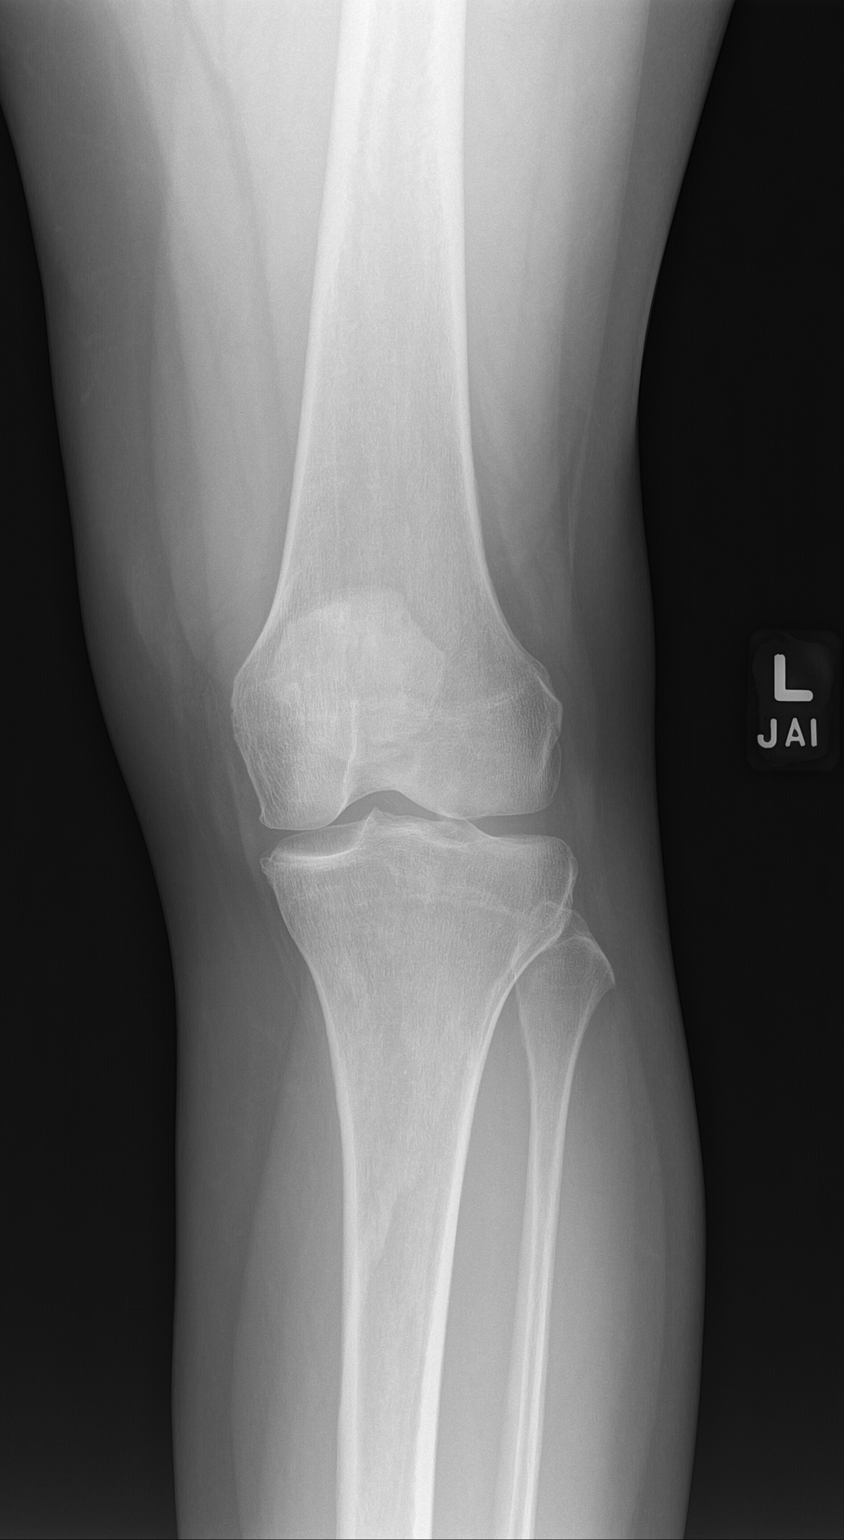

[knee obl (1 of 2)]
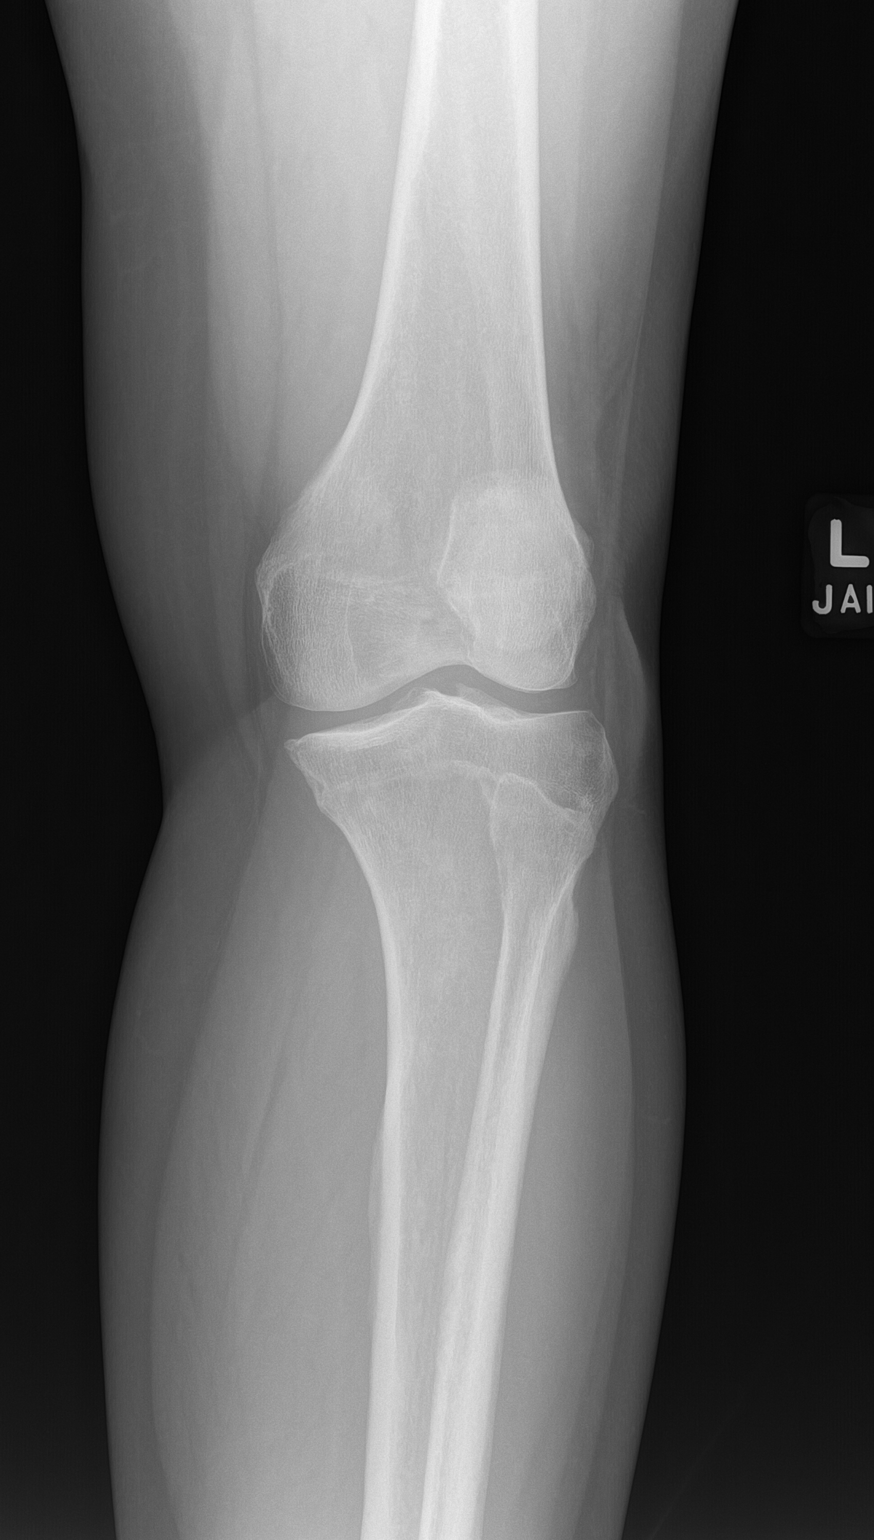

[knee obl (2 of 2)]
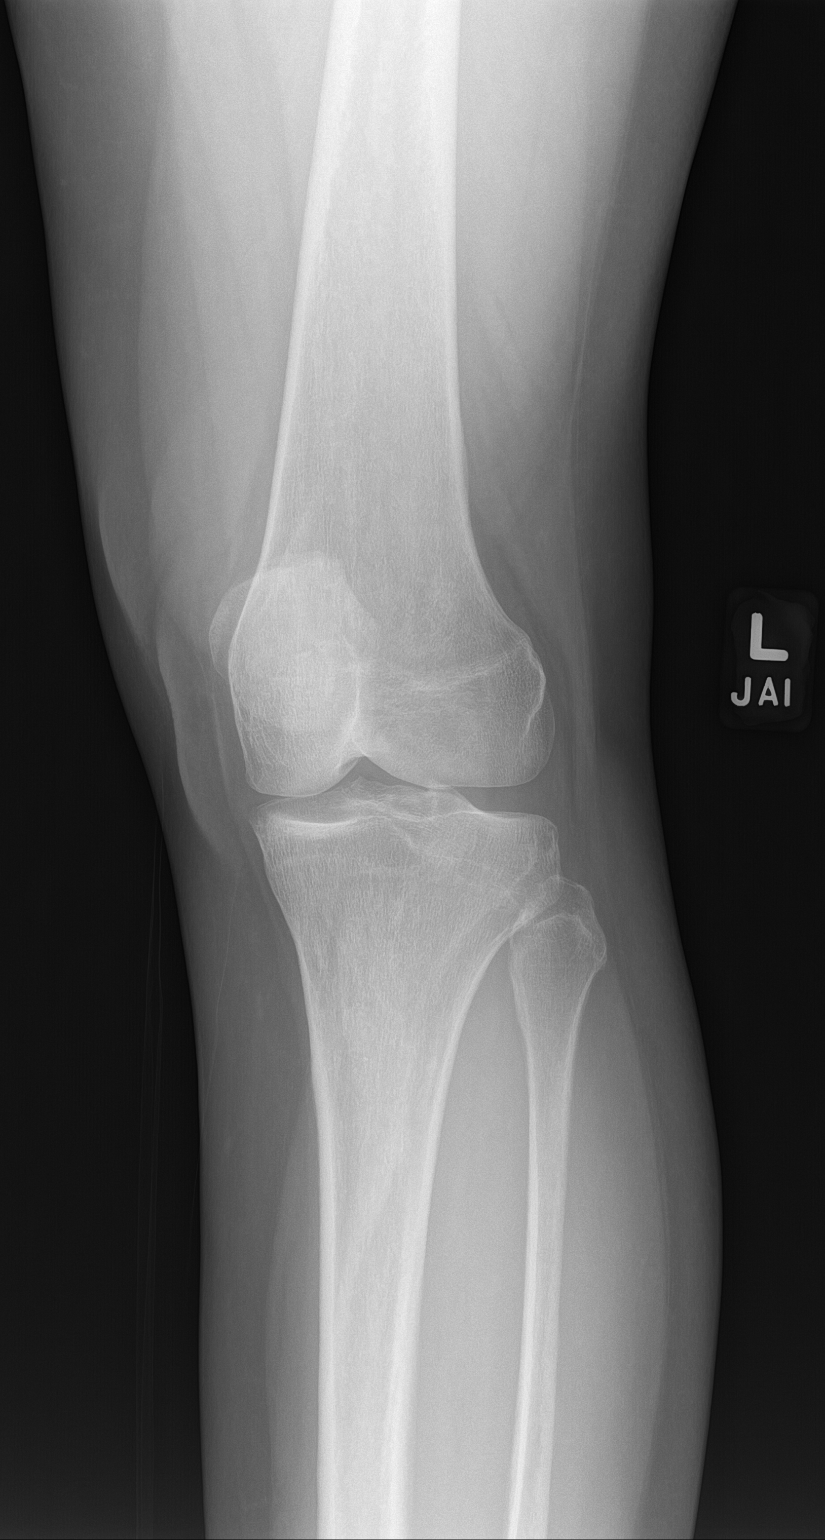

[knee lat]
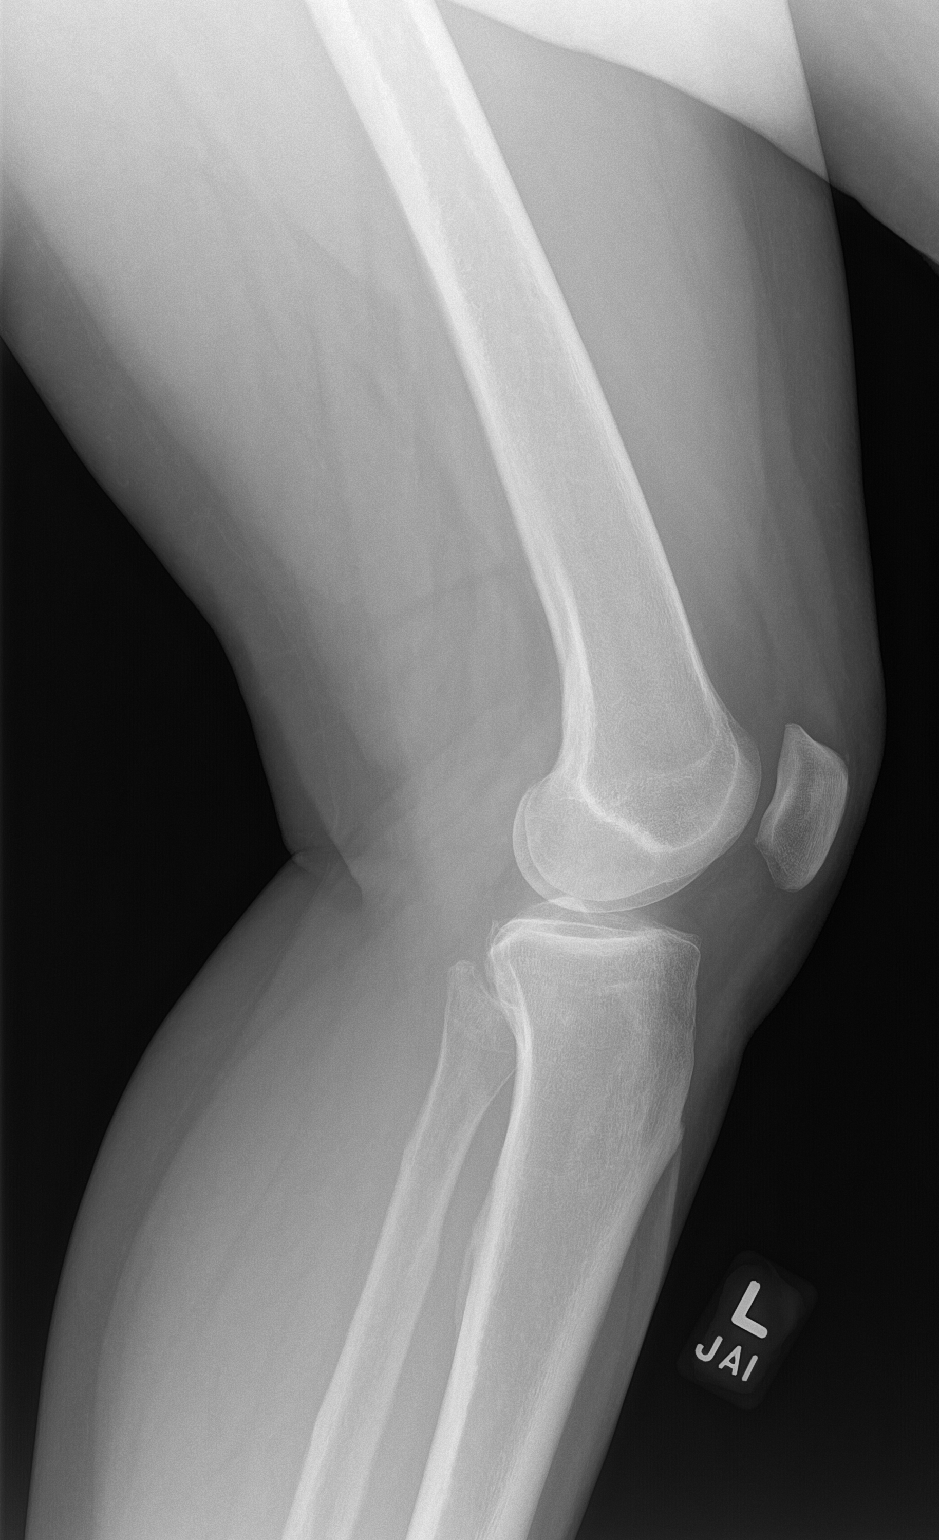

[4 of 4 positions shown; findings below may reference images not displayed]

FINDINGS: No evidence of fracture, dislocation, or joint effusion. No evidence
of arthropathy or other focal bone abnormality. Soft tissues are
unremarkable.
IMPRESSION: Negative.

## 2022-09-24 IMAGING — MR MR KNEE*L* W/O CM
4 of 6 series · 22 of 40 positions shown · non-contrast
Comparison: Radiographs 05/24/2020.

CLINICAL DATA: Chronic knee pain following injury playing tennis 8
months ago. No previous relevant surgery. Some relief from previous
knee injection.

EXAM:
MRI OF THE LEFT KNEE WITHOUT CONTRAST
TECHNIQUE: Multiplanar, multisequence MR imaging of the knee was performed. No
intravenous contrast was administered.

[Series 3: T2 fat-sat · axial · 4.0mm · 0.50mm/px · z∈[-70,+45]mm · 6 of 24 slices shown (1 of 2)]
[im 1/24]
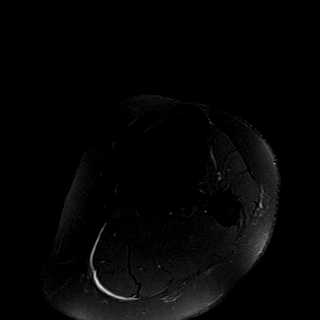
[im 5/24]
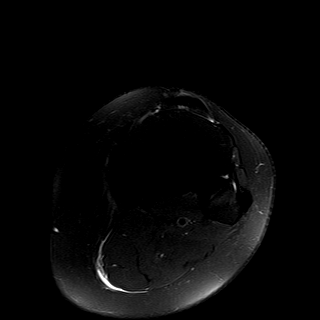
[im 10/24]
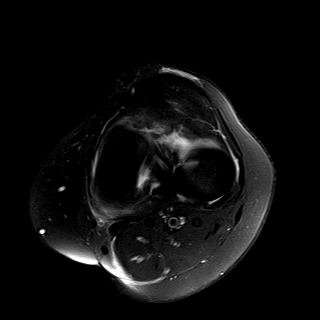
[im 14/24]
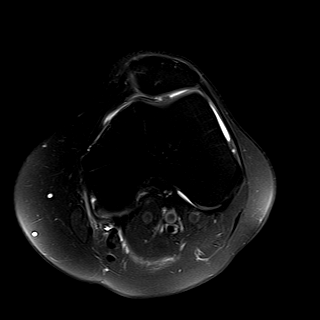
[im 19/24]
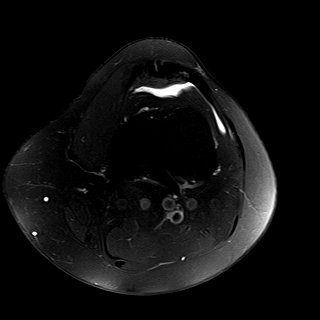
[im 24/24]
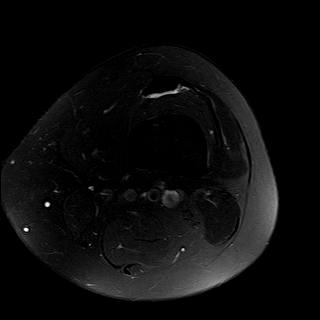

[Series 5: T2 fat-sat · coronal · 4.0mm · 0.29mm/px · 3 of 26 slices shown (2 of 2)]
[im 5/26]
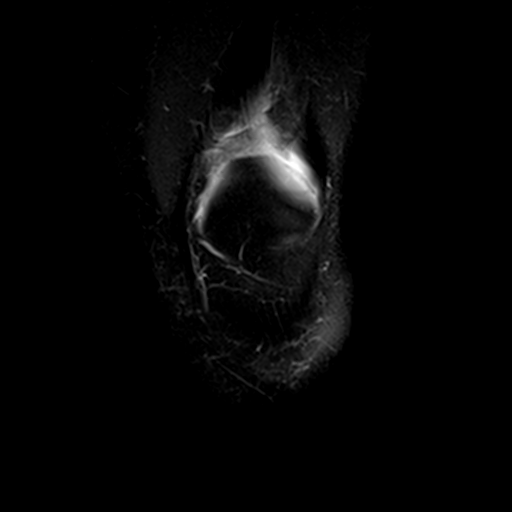
[im 13/26]
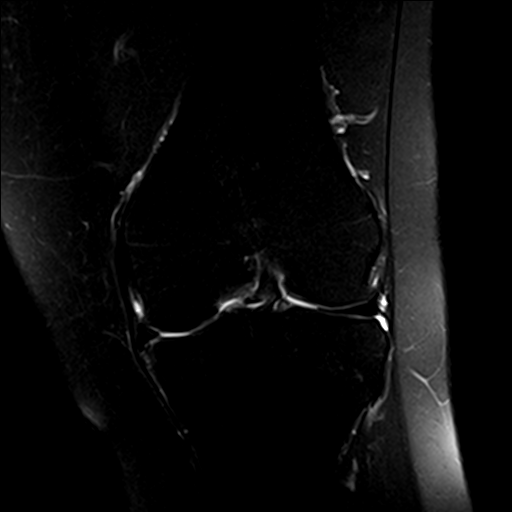
[im 21/26]
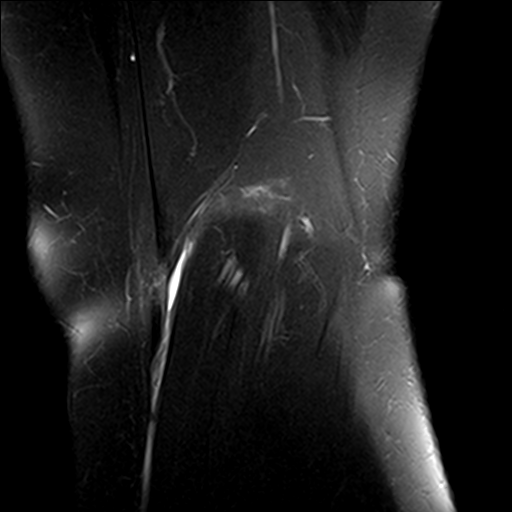

[Series 7: PD fat-sat · sagittal · 3.0mm · 0.29mm/px · 7 of 26 slices shown (1 of 2)]
[im 1/26]
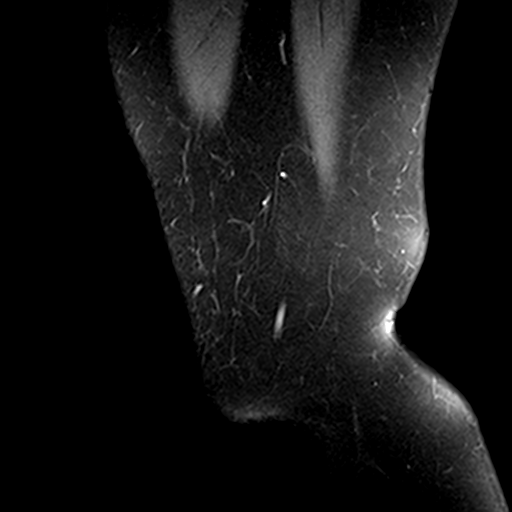
[im 5/26]
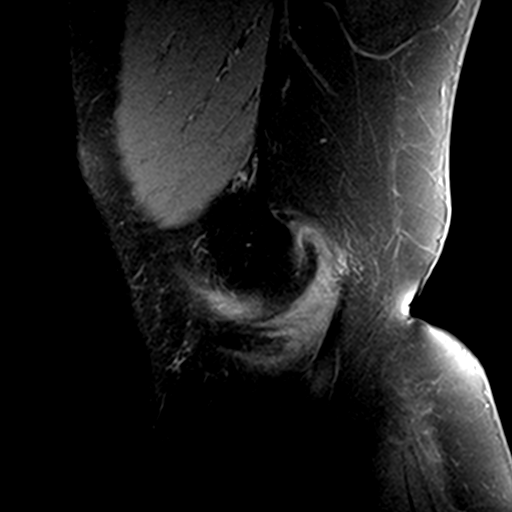
[im 9/26]
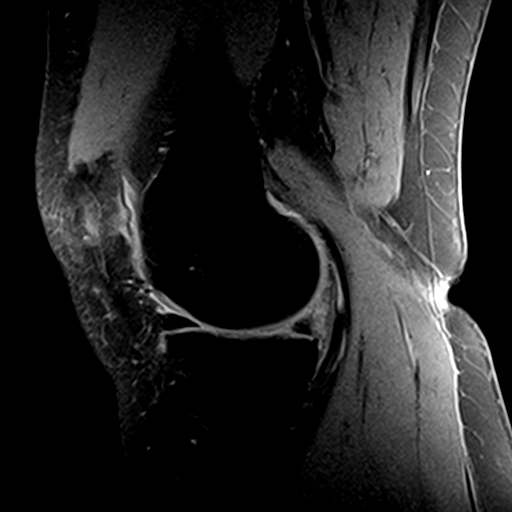
[im 13/26]
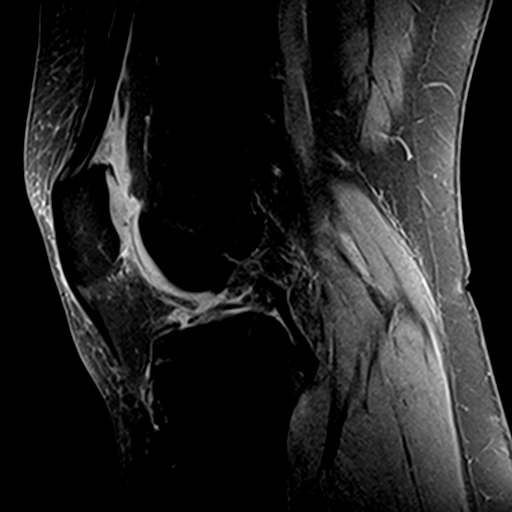
[im 17/26]
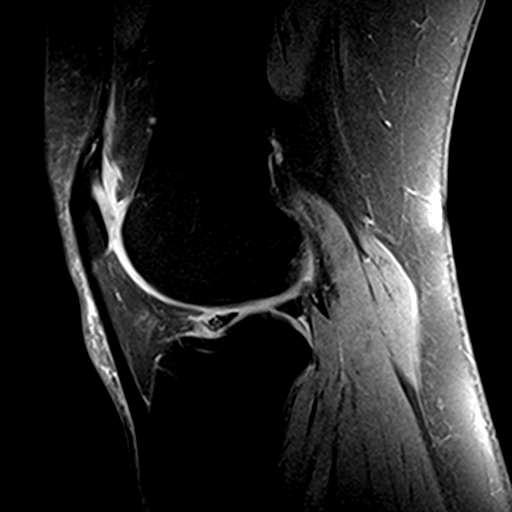
[im 21/26]
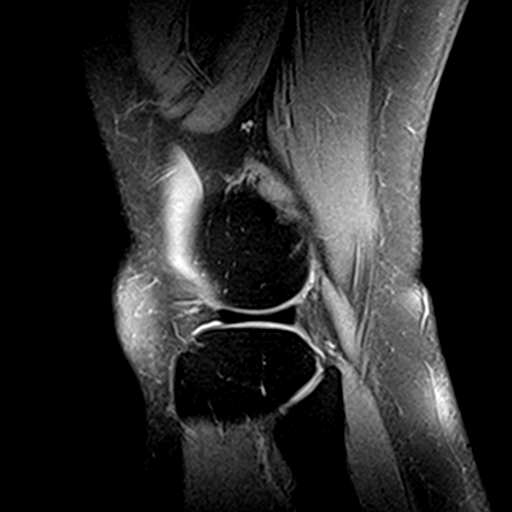
[im 26/26]
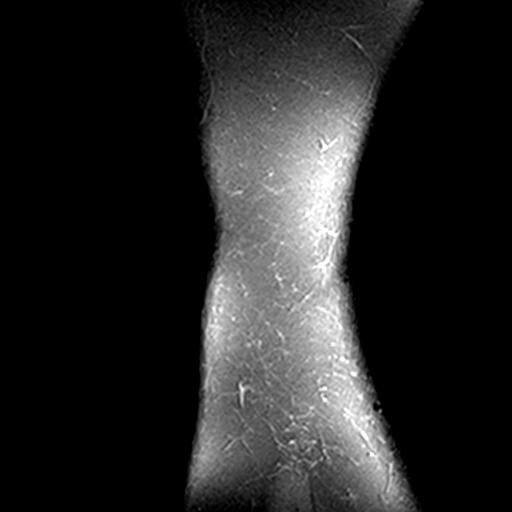

[Series 8: PD fat-sat · coronal · 3.0mm · 0.29mm/px · 6 of 21 slices shown (2 of 2)]
[im 1/21]
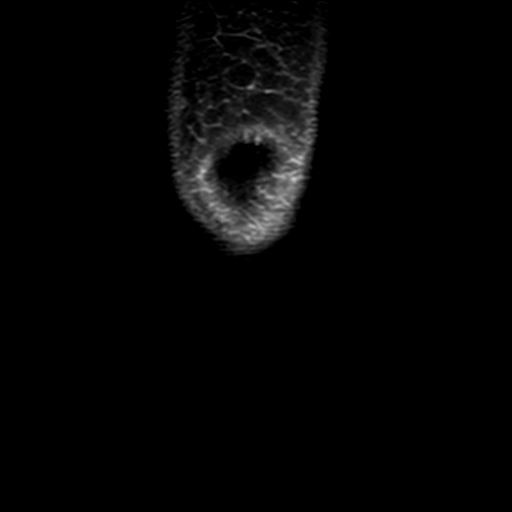
[im 5/21]
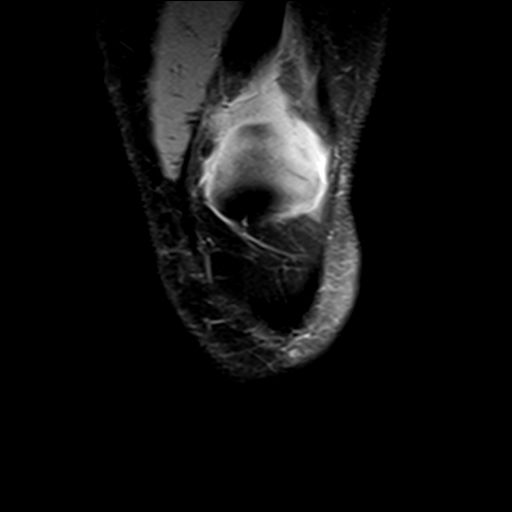
[im 9/21]
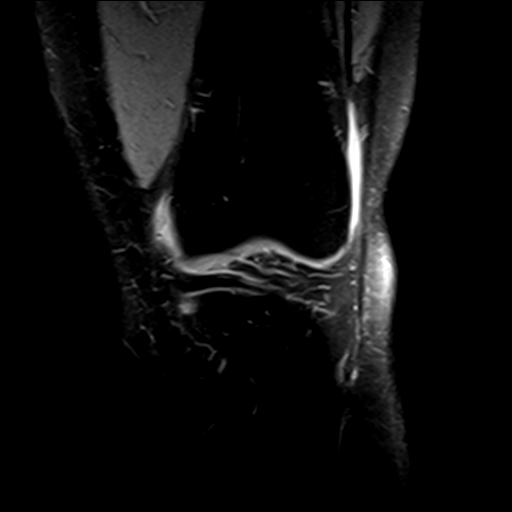
[im 13/21]
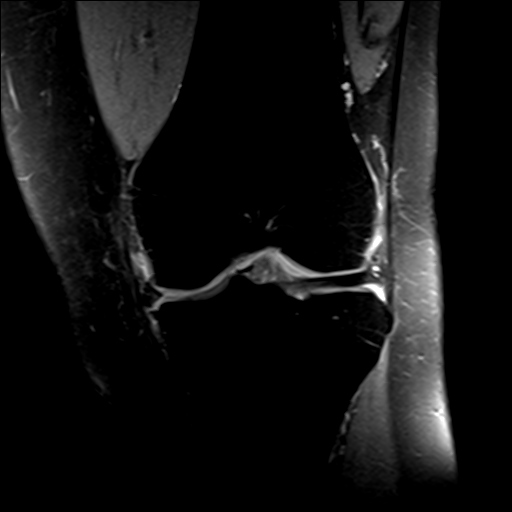
[im 17/21]
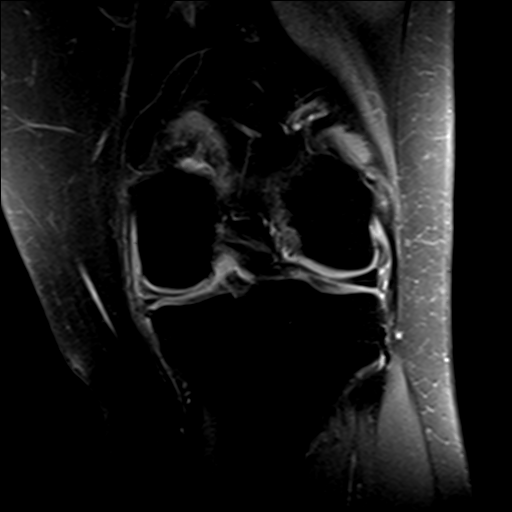
[im 21/21]
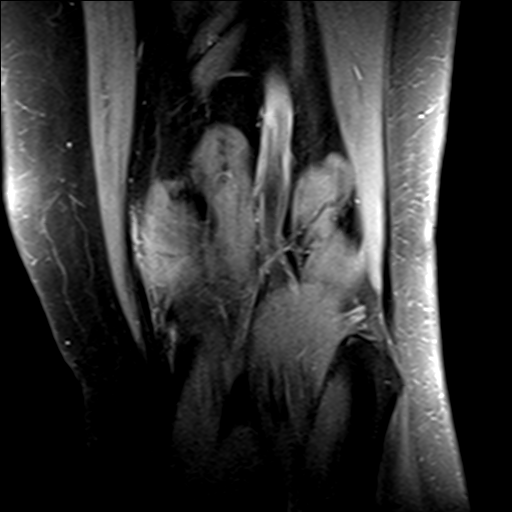

[22 of 40 positions shown; findings below may reference images not displayed]

FINDINGS: MENISCI

Medial meniscus: There is an oblique tear of the posterior horn
which extends to the inferior articular surface. There is free edge
irregularity of the posterior horn and body. The meniscal root is
intact. No centrally displaced meniscal fragment identified.

Lateral meniscus:  Intact with normal morphology.

LIGAMENTS

Cruciates:  Intact.

Collaterals:  Intact.

CARTILAGE

Patellofemoral:  Preserved.

Medial: Mild chondral thinning and surface irregularity without
focal chondral defect.

Lateral:  Preserved.

MISCELLANEOUS

Joint:  No significant joint effusion.

Popliteal Fossa: Small Baker's cyst which appears ruptured with
edema extending superficial to the gastrocnemius muscle in the
proximal lower leg.

Extensor Mechanism:  Intact.

Bones:  No acute or significant extra-articular osseous findings.

Other: No other significant periarticular soft tissue findings.
IMPRESSION: 1. Degenerative-appearing tear of the posterior horn and body of the
medial meniscus as described. No displaced meniscal fragment.
2. The lateral meniscus, cruciate and collateral ligaments are
intact.
3. Mild medial compartment degenerative chondrosis without acute
osseous findings.
4. Partially ruptured small Baker's cyst.

## 2022-11-08 ENCOUNTER — Emergency Department (HOSPITAL_BASED_OUTPATIENT_CLINIC_OR_DEPARTMENT_OTHER): Payer: 59

## 2022-11-08 ENCOUNTER — Encounter (HOSPITAL_COMMUNITY)
Admission: EM | Disposition: A | Discharge status: Home / Self Care | Payer: Self-pay | Source: Home / Self Care | Attending: Surgery

## 2022-11-08 ENCOUNTER — Inpatient Hospital Stay (HOSPITAL_COMMUNITY): Payer: 59 | Admitting: Anesthesiology

## 2022-11-08 ENCOUNTER — Inpatient Hospital Stay (HOSPITAL_BASED_OUTPATIENT_CLINIC_OR_DEPARTMENT_OTHER)
Admission: EM | Admit: 2022-11-08 | Discharge: 2022-11-08 | DRG: 346 | Disposition: A | Discharge status: Home / Self Care | Payer: 59 | Attending: Surgery | Admitting: Surgery

## 2022-11-08 ENCOUNTER — Other Ambulatory Visit: Payer: Self-pay

## 2022-11-08 ENCOUNTER — Encounter (HOSPITAL_BASED_OUTPATIENT_CLINIC_OR_DEPARTMENT_OTHER): Payer: Self-pay

## 2022-11-08 DIAGNOSIS — K562 Volvulus: Secondary | ICD-10-CM

## 2022-11-08 DIAGNOSIS — Z79890 Hormone replacement therapy (postmenopausal): Secondary | ICD-10-CM

## 2022-11-08 DIAGNOSIS — E039 Hypothyroidism, unspecified: Secondary | ICD-10-CM | POA: Diagnosis present

## 2022-11-08 DIAGNOSIS — Z79899 Other long term (current) drug therapy: Secondary | ICD-10-CM | POA: Diagnosis not present

## 2022-11-08 HISTORY — PX: LAPAROSCOPY: SHX197

## 2022-11-08 LAB — CBC
HCT: 39.2 % (ref 36.0–46.0)
Hemoglobin: 14 g/dL (ref 12.0–15.0)
MCH: 32.2 pg (ref 26.0–34.0)
MCHC: 35.7 g/dL (ref 30.0–36.0)
MCV: 90.1 fL (ref 80.0–100.0)
Platelets: 250 10*3/uL (ref 150–400)
RBC: 4.35 MIL/uL (ref 3.87–5.11)
RDW: 11.9 % (ref 11.5–15.5)
WBC: 7.5 10*3/uL (ref 4.0–10.5)
nRBC: 0 % (ref 0.0–0.2)

## 2022-11-08 LAB — SURGICAL PCR SCREEN
MRSA, PCR: NEGATIVE
Staphylococcus aureus: POSITIVE — AB

## 2022-11-08 LAB — COMPREHENSIVE METABOLIC PANEL
ALT: 36 U/L (ref 0–44)
AST: 20 U/L (ref 15–41)
Albumin: 4.7 g/dL (ref 3.5–5.0)
Alkaline Phosphatase: 44 U/L (ref 38–126)
Anion gap: 9 (ref 5–15)
BUN: 22 mg/dL — ABNORMAL HIGH (ref 6–20)
CO2: 24 mmol/L (ref 22–32)
Calcium: 9.3 mg/dL (ref 8.9–10.3)
Chloride: 106 mmol/L (ref 98–111)
Creatinine, Ser: 1 mg/dL (ref 0.44–1.00)
GFR, Estimated: >60 mL/min (ref >60)
Glucose, Bld: 109 mg/dL — ABNORMAL HIGH (ref 70–99)
Potassium: 4 mmol/L (ref 3.5–5.1)
Sodium: 139 mmol/L (ref 135–145)
Total Bilirubin: 0.5 mg/dL (ref 0.3–1.2)
Total Protein: 7.1 g/dL (ref 6.5–8.1)

## 2022-11-08 LAB — TYPE AND SCREEN
ABO/RH(D): A POS
Antibody Screen: NEGATIVE

## 2022-11-08 LAB — LIPASE, BLOOD: Lipase: 33 U/L (ref 11–51)

## 2022-11-08 LAB — GLUCOSE, CAPILLARY
Glucose-Capillary: 85 mg/dL (ref 70–99)
Glucose-Capillary: 75 mg/dL (ref 70–99)

## 2022-11-08 LAB — ABO/RH: ABO/RH(D): A POS

## 2022-11-08 SURGERY — LAPAROSCOPY, DIAGNOSTIC
Anesthesia: General | Site: Abdomen

## 2022-11-08 MED ORDER — ROCURONIUM BROMIDE 10 MG/ML (PF) SYRINGE
PREFILLED_SYRINGE | INTRAVENOUS | Status: DC | PRN
  Administered 2022-11-08: 60 mg via INTRAVENOUS

## 2022-11-08 MED ORDER — BUPIVACAINE-EPINEPHRINE (PF) 0.25% -1:200000 IJ SOLN
INTRAMUSCULAR | Status: AC
  Filled 2022-11-08: qty 30

## 2022-11-08 MED ORDER — OXYCODONE HCL 5 MG PO TABS: 5.0000 mg | ORAL_TABLET | ORAL | 0 refills | Status: AC | PRN

## 2022-11-08 MED ORDER — LIDOCAINE 2% (20 MG/ML) 5 ML SYRINGE
INTRAMUSCULAR | Status: DC | PRN
  Administered 2022-11-08: 100 mg via INTRAVENOUS

## 2022-11-08 MED ORDER — MIDAZOLAM HCL 2 MG/2ML IJ SOLN
INTRAMUSCULAR | Status: AC
  Filled 2022-11-08: qty 2

## 2022-11-08 MED ORDER — DEXAMETHASONE SODIUM PHOSPHATE 10 MG/ML IJ SOLN
INTRAMUSCULAR | Status: DC | PRN
  Administered 2022-11-08: 10 mg via INTRAVENOUS

## 2022-11-08 MED ORDER — THYROID 30 MG PO TABS: 30.0000 mg | ORAL_TABLET | Freq: Every morning | ORAL | Status: DC

## 2022-11-08 MED ORDER — FENTANYL CITRATE (PF) 250 MCG/5ML IJ SOLN
INTRAMUSCULAR | Status: DC | PRN
  Administered 2022-11-08 (×2): 100 ug via INTRAVENOUS

## 2022-11-08 MED ORDER — MIDAZOLAM HCL 2 MG/2ML IJ SOLN
INTRAMUSCULAR | Status: DC | PRN
  Administered 2022-11-08: 2 mg via INTRAVENOUS

## 2022-11-08 MED ORDER — PROMETHAZINE HCL 25 MG/ML IJ SOLN: 6.2500 mg | INTRAMUSCULAR | Status: DC | PRN

## 2022-11-08 MED ORDER — SODIUM CHLORIDE 0.9 % IV SOLN
2.0000 g | INTRAVENOUS | Status: AC
  Administered 2022-11-08: 2 g via INTRAVENOUS
  Filled 2022-11-08: qty 2

## 2022-11-08 MED ORDER — SUGAMMADEX SODIUM 200 MG/2ML IV SOLN
INTRAVENOUS | Status: DC | PRN
  Administered 2022-11-08 (×2): 200 mg via INTRAVENOUS

## 2022-11-08 MED ORDER — MORPHINE SULFATE (PF) 4 MG/ML IV SOLN
4.0000 mg | Freq: Once | INTRAVENOUS | Status: AC
  Administered 2022-11-08: 4 mg via INTRAVENOUS
  Filled 2022-11-08: qty 1

## 2022-11-08 MED ORDER — KETOROLAC TROMETHAMINE 30 MG/ML IJ SOLN
30.0000 mg | Freq: Once | INTRAMUSCULAR | Status: AC
  Administered 2022-11-08: 30 mg via INTRAVENOUS
  Filled 2022-11-08: qty 1

## 2022-11-08 MED ORDER — CHLORHEXIDINE GLUCONATE 0.12 % MT SOLN
15.0000 mL | Freq: Once | OROMUCOSAL | Status: AC
  Filled 2022-11-08: qty 15

## 2022-11-08 MED ORDER — PROGESTERONE MICRONIZED 100 MG PO CAPS: 100.0000 mg | ORAL_CAPSULE | Freq: Every day | ORAL | Status: DC

## 2022-11-08 MED ORDER — ONDANSETRON HCL 4 MG/2ML IJ SOLN
INTRAMUSCULAR | Status: AC
  Filled 2022-11-08: qty 2

## 2022-11-08 MED ORDER — ONDANSETRON HCL 4 MG/2ML IJ SOLN
4.0000 mg | Freq: Once | INTRAMUSCULAR | Status: AC
  Administered 2022-11-08: 4 mg via INTRAVENOUS
  Filled 2022-11-08: qty 2

## 2022-11-08 MED ORDER — IBUPROFEN 600 MG PO TABS: 600.0000 mg | ORAL_TABLET | Freq: Four times a day (QID) | ORAL | 1 refills | Status: AC

## 2022-11-08 MED ORDER — DEXAMETHASONE SODIUM PHOSPHATE 10 MG/ML IJ SOLN
INTRAMUSCULAR | Status: AC
  Filled 2022-11-08: qty 1

## 2022-11-08 MED ORDER — LACTATED RINGERS IV SOLN: INTRAVENOUS | Status: DC

## 2022-11-08 MED ORDER — ACETAMINOPHEN 500 MG PO TABS: 1000.0000 mg | ORAL_TABLET | Freq: Four times a day (QID) | ORAL | 3 refills | Status: AC

## 2022-11-08 MED ORDER — HYDROMORPHONE HCL 1 MG/ML IJ SOLN: 0.2500 mg | INTRAMUSCULAR | Status: DC | PRN

## 2022-11-08 MED ORDER — METHOCARBAMOL 750 MG PO TABS: 750.0000 mg | ORAL_TABLET | Freq: Four times a day (QID) | ORAL | 1 refills | Status: AC

## 2022-11-08 MED ORDER — ROCURONIUM BROMIDE 10 MG/ML (PF) SYRINGE
PREFILLED_SYRINGE | INTRAVENOUS | Status: AC
  Filled 2022-11-08: qty 10

## 2022-11-08 MED ORDER — TRAZODONE HCL 50 MG PO TABS: 50.0000 mg | ORAL_TABLET | Freq: Every evening | ORAL | Status: DC | PRN

## 2022-11-08 MED ORDER — SODIUM CHLORIDE 0.9 % IR SOLN
Status: DC | PRN
  Administered 2022-11-08: 1000 mL

## 2022-11-08 MED ORDER — ONDANSETRON HCL 4 MG/2ML IJ SOLN
INTRAMUSCULAR | Status: DC | PRN
  Administered 2022-11-08: 4 mg via INTRAVENOUS

## 2022-11-08 MED ORDER — MEPERIDINE HCL 25 MG/ML IJ SOLN: 6.2500 mg | INTRAMUSCULAR | Status: DC | PRN

## 2022-11-08 MED ORDER — ORAL CARE MOUTH RINSE: 15.0000 mL | Freq: Once | OROMUCOSAL | Status: AC

## 2022-11-08 MED ORDER — BUPIVACAINE-EPINEPHRINE 0.5% -1:200000 IJ SOLN
INTRAMUSCULAR | Status: DC | PRN
  Administered 2022-11-08: 18 mL

## 2022-11-08 MED ORDER — PROPOFOL 10 MG/ML IV BOLUS
INTRAVENOUS | Status: AC
  Filled 2022-11-08: qty 20

## 2022-11-08 MED ORDER — ACETAMINOPHEN 500 MG PO TABS: 1000.0000 mg | ORAL_TABLET | Freq: Four times a day (QID) | ORAL | Status: DC | PRN

## 2022-11-08 MED ORDER — MORPHINE SULFATE (PF) 2 MG/ML IV SOLN: 2.0000 mg | INTRAVENOUS | Status: DC | PRN

## 2022-11-08 MED ORDER — ACETAMINOPHEN 500 MG PO TABS
ORAL_TABLET | ORAL | Status: AC
  Administered 2022-11-08: 1000 mg via ORAL
  Filled 2022-11-08: qty 2

## 2022-11-08 MED ORDER — SODIUM CHLORIDE 0.9 % IV SOLN: INTRAVENOUS | Status: DC

## 2022-11-08 MED ORDER — FENTANYL CITRATE (PF) 250 MCG/5ML IJ SOLN
INTRAMUSCULAR | Status: AC
  Filled 2022-11-08: qty 5

## 2022-11-08 MED ORDER — LIDOCAINE 2% (20 MG/ML) 5 ML SYRINGE
INTRAMUSCULAR | Status: AC
  Filled 2022-11-08: qty 5

## 2022-11-08 MED ORDER — CHLORHEXIDINE GLUCONATE 0.12 % MT SOLN
OROMUCOSAL | Status: AC
  Administered 2022-11-08: 15 mL via OROMUCOSAL
  Filled 2022-11-08: qty 15

## 2022-11-08 MED ORDER — ACETAMINOPHEN 500 MG PO TABS
1000.0000 mg | ORAL_TABLET | ORAL | Status: AC
  Filled 2022-11-08: qty 2

## 2022-11-08 MED ORDER — HYDROMORPHONE HCL 1 MG/ML IJ SOLN: 1.0000 mg | INTRAMUSCULAR | Status: DC | PRN

## 2022-11-08 MED ORDER — PROPOFOL 10 MG/ML IV BOLUS
INTRAVENOUS | Status: DC | PRN
  Administered 2022-11-08: 130 mg via INTRAVENOUS

## 2022-11-08 MED ORDER — AMISULPRIDE (ANTIEMETIC) 5 MG/2ML IV SOLN: 10.0000 mg | Freq: Once | INTRAVENOUS | Status: DC | PRN

## 2022-11-08 MED ORDER — ONDANSETRON 4 MG PO TBDP: 4.0000 mg | ORAL_TABLET | Freq: Three times a day (TID) | ORAL | Status: DC | PRN

## 2022-11-08 MED ORDER — 0.9 % SODIUM CHLORIDE (POUR BTL) OPTIME
TOPICAL | Status: DC | PRN
  Administered 2022-11-08: 1000 mL

## 2022-11-08 SURGICAL SUPPLY — 72 items
ADH SKN CLS APL DERMABOND .7 (GAUZE/BANDAGES/DRESSINGS) ×3
APL PRP STRL LF DISP 70% ISPRP (MISCELLANEOUS) ×3
APPLIER CLIP ROT 10 11.4 M/L (STAPLE)
APR CLP MED LRG 11.4X10 (STAPLE)
BAG COUNTER SPONGE SURGICOUNT (BAG) ×3 IMPLANT
BAG SPEC RTRVL 10 TROC 200 (ENDOMECHANICALS)
BAG SPNG CNTER NS LX DISP (BAG) ×3
BLADE CLIPPER SURG (BLADE) IMPLANT
CANISTER SUCT 3000ML PPV (MISCELLANEOUS) ×3 IMPLANT
CHLORAPREP W/TINT 26 (MISCELLANEOUS) ×3 IMPLANT
CLIP APPLIE ROT 10 11.4 M/L (STAPLE) IMPLANT
COVER SURGICAL LIGHT HANDLE (MISCELLANEOUS) ×3 IMPLANT
CUTTER FLEX LINEAR 45M (STAPLE) ×1 IMPLANT
DERMABOND ADVANCED .7 DNX12 (GAUZE/BANDAGES/DRESSINGS) ×3 IMPLANT
DRSG OPSITE POSTOP 4X10 (GAUZE/BANDAGES/DRESSINGS) IMPLANT
DRSG OPSITE POSTOP 4X8 (GAUZE/BANDAGES/DRESSINGS) IMPLANT
ELECT CAUTERY BLADE 6.4 (BLADE) ×6 IMPLANT
ELECT REM PT RETURN 9FT ADLT (ELECTROSURGICAL) ×3
ELECTRODE REM PT RTRN 9FT ADLT (ELECTROSURGICAL) ×3 IMPLANT
ENDOLOOP SUT PDS II 0 18 (SUTURE) IMPLANT
GLOVE BIO SURGEON STRL SZ8 (GLOVE) ×6 IMPLANT
GLOVE BIOGEL PI IND STRL 6 (GLOVE) ×2 IMPLANT
GLOVE BIOGEL PI IND STRL 8 (GLOVE) ×6 IMPLANT
GLOVE BIOGEL PI MICRO STRL 5.5 (GLOVE) ×2 IMPLANT
GOWN STRL REUS W/ TWL LRG LVL3 (GOWN DISPOSABLE) ×10 IMPLANT
GOWN STRL REUS W/ TWL XL LVL3 (GOWN DISPOSABLE) ×3 IMPLANT
GOWN STRL REUS W/TWL LRG LVL3 (GOWN DISPOSABLE) ×6
GOWN STRL REUS W/TWL XL LVL3 (GOWN DISPOSABLE) ×3
IRRIG SUCT STRYKERFLOW 2 WTIP (MISCELLANEOUS) ×3
IRRIGATION SUCT STRKRFLW 2 WTP (MISCELLANEOUS) ×3 IMPLANT
KIT BASIN OR (CUSTOM PROCEDURE TRAY) ×3 IMPLANT
KIT SIGMOIDOSCOPE (SET/KITS/TRAYS/PACK) IMPLANT
KIT TURNOVER KIT B (KITS) ×3 IMPLANT
LIGASURE IMPACT 36 18CM CVD LR (INSTRUMENTS) IMPLANT
LIGASURE LAP MARYLAND 5MM 37CM (ELECTROSURGICAL) IMPLANT
NS IRRIG 1000ML POUR BTL (IV SOLUTION) ×4 IMPLANT
PACK COLON (CUSTOM PROCEDURE TRAY) ×1 IMPLANT
PAD ARMBOARD 7.5X6 YLW CONV (MISCELLANEOUS) ×6 IMPLANT
PENCIL SMOKE EVACUATOR (MISCELLANEOUS) ×1 IMPLANT
POUCH RETRIEVAL ECOSAC 10 (ENDOMECHANICALS) ×1 IMPLANT
RELOAD STAPLE 45 3.5 BLU ETS (ENDOMECHANICALS) ×1 IMPLANT
RELOAD STAPLE TA45 3.5 REG BLU (ENDOMECHANICALS) IMPLANT
SCISSORS LAP 5X35 DISP (ENDOMECHANICALS) ×1 IMPLANT
SET TUBE SMOKE EVAC HIGH FLOW (TUBING) ×3 IMPLANT
SHEARS HARMONIC ACE PLUS 36CM (ENDOMECHANICALS) ×1 IMPLANT
SPECIMEN JAR SMALL (MISCELLANEOUS) ×3 IMPLANT
SPECIMEN JAR X LARGE (MISCELLANEOUS) ×3 IMPLANT
SPONGE T-LAP 18X18 ~~LOC~~+RFID (SPONGE) IMPLANT
STAPLER VISISTAT 35W (STAPLE) ×1 IMPLANT
SURGILUBE 2OZ TUBE FLIPTOP (MISCELLANEOUS) IMPLANT
SUT MNCRL AB 4-0 PS2 18 (SUTURE) ×2 IMPLANT
SUT MON AB 3-0 SH 27 (SUTURE) ×3
SUT MON AB 3-0 SH27 (SUTURE) ×2 IMPLANT
SUT MON AB 4-0 PC3 18 (SUTURE) ×1 IMPLANT
SUT PDS AB 1 CTX 36 (SUTURE) IMPLANT
SUT PROLENE 2 0 CT2 30 (SUTURE) ×2 IMPLANT
SUT PROLENE 2 0 KS (SUTURE) IMPLANT
SUT VIC AB 3-0 SH 18 (SUTURE) ×1 IMPLANT
SUT VICRYL AB 2 0 TIES (SUTURE) ×3 IMPLANT
SUT VICRYL AB 3 0 TIES (SUTURE) ×3 IMPLANT
TOWEL GREEN STERILE (TOWEL DISPOSABLE) ×3 IMPLANT
TOWEL GREEN STERILE FF (TOWEL DISPOSABLE) ×3 IMPLANT
TRAY FOLEY MTR SLVR 14FR STAT (SET/KITS/TRAYS/PACK) IMPLANT
TRAY FOLEY W/BAG SLVR 16FR (SET/KITS/TRAYS/PACK)
TRAY FOLEY W/BAG SLVR 16FR ST (SET/KITS/TRAYS/PACK) ×1 IMPLANT
TRAY LAPAROSCOPIC MC (CUSTOM PROCEDURE TRAY) ×3 IMPLANT
TROCAR BALLN 12MMX100 BLUNT (TROCAR) ×3 IMPLANT
TROCAR XCEL BLADELESS 5X75MML (TROCAR) ×6 IMPLANT
TROCAR XCEL NON-BLD 5MMX100MML (ENDOMECHANICALS) ×2 IMPLANT
TUBE CONNECTING 12X1/4 (SUCTIONS) ×4 IMPLANT
WARMER LAPAROSCOPE (MISCELLANEOUS) ×3 IMPLANT
WATER STERILE IRR 1000ML POUR (IV SOLUTION) ×3 IMPLANT

## 2022-11-08 NOTE — Op Note (Signed)
Preoperative diagnosis: Cecal bascule  Postoperative diagnosis: Normal cecum and normal laparoscopy  Procedure: Diagnostic laparoscopy  Surgeon: Harriette Bouillon, MD  Assistant:Dr Clabe Seal MD   I was personally present during the key and critical portions of this procedure and immediately available throughout the entire procedure, as documented in my operative note.     Anesthesia: General with 0.25% Marcaine plain  EBL: Minimal  IV fluids: Per anesthesia record  Indications for procedure: The patient presents for laparoscopy after being seen last time the emergency room for right-sided flank pain.  Stone survey was negative but she had what appeared to be a large cecal bascule concern for possible cecal volvulus as well.  She was transferred for further care.  She was found to have right-sided pain.  Recommend laparoscopy with possible colon resection necessary pexied to high riding cecum and concern for volvulus.  Risks and benefits of surgery reviewed.  Potential complications of procedure reviewed.  Discussed potential bowel resection and ostomy.  Discussed potential bowel injury with procedure.  She voiced understanding and agreed to proceed.  Description of procedure: The patient that is only area of the procedure reviewed with her husband.  All questions were answered.  She was brought back to the operative room.  She was placed supine upon the OR table.  After induction of general the left arm was tucked the right arm was placed on armboard.  The abdomen prepped and draped in sterile fashion timeout performed.  Proper patient, site and procedure verified.  A 5 mm Optiview port was placed in the right upper quadrant.  Small incision was made and the port was advanced with guidance from the cannula as well as layers of the abdominal wall into the abdominal cavity without difficulty.  No signs of bowel obstruction explored.  I then placed 3 left-sided 5 mm ports.  We then placed a  laparoscope.  The cecum was actually in the right lower quadrant which should be anatomically.  The appendix was normal.  The cecum had very little to it was quite fixed.  It was normal in appearance without vascular or signs of ischemia.  There is not enough movement of the cecum to consider both this either.  We ran the acing colon up to the hepatic flexure, transverse colon, ascending colon down to the 6 colon toward the rectum.  This was all grossly normal.  Small bowel was run from the ligament of Treves backwards.  There is no Meckel's diverticulum.  No signs of bowel obstruction or inflammatory bowel disease.  Gallbladder was normal.  Stomach was normal.  Liver was normal.  No evidence of internal hernia.  Given normal findings, no further plans for resection were followed.  We then reviewed the reports allowed the CO2 to escape.  4 Monocryl used to close the skin incision Dermabond applied.  All counts found to be correct.  Patient was awoke extubated taken recovery in satisfactory condition.

## 2022-11-08 NOTE — Anesthesia Procedure Notes (Signed)
Procedure Name: Intubation Date/Time: 11/08/2022 2:43 PM  Performed by: Carolynne Edouard, RNPre-anesthesia Checklist: Patient identified, Emergency Drugs available, Suction available and Patient being monitored Patient Re-evaluated:Patient Re-evaluated prior to induction Oxygen Delivery Method: Circle system utilized Preoxygenation: Pre-oxygenation with 100% oxygen Induction Type: IV induction Ventilation: Mask ventilation without difficulty Laryngoscope Size: Mac and 3 Grade View: Grade I Tube type: Oral Tube size: 7.0 mm Number of attempts: 1 Airway Equipment and Method: Stylet and Oral airway Placement Confirmation: ETT inserted through vocal cords under direct vision, positive ETCO2 and breath sounds checked- equal and bilateral Secured at: 21 cm Tube secured with: Tape Dental Injury: Teeth and Oropharynx as per pre-operative assessment

## 2022-11-08 NOTE — Interval H&P Note (Signed)
History and Physical Interval Note:  11/08/2022 1:59 PM  Betty Price  has presented today for surgery, with the diagnosis of CECAL BASCULAR.  The various methods of treatment have been discussed with the patient and family. After consideration of risks, benefits and other options for treatment, the patient has consented to  Procedure(s): LAPAROSCOPIC CECECTOMY (N/A) POSSIBLE  COLECTOMY (Right) as a surgical intervention.  The patient's history has been reviewed, patient examined, no change in status, stable for surgery.  I have reviewed the patient's chart and labs.  Questions were answered to the patient's satisfaction.    Patient seen and examined chart reviewed.  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  This is in the left upper quadrant causing a mechanical type obstruction.  Recommend diagnostic laparoscopy with partial seek ectomy and/or colectomy.  The other option is pexing which has a high risk of recurrence.  Risks and benefits reviewed of surgery.  Risk of bleeding, infection, anastomotic leak, abscess, ostomy, the need for additional surgery, cardiovascular risk, VTE, PE and anesthesia risk or exacerbation of underlying medical problems. Haydin Dunn A Kitty Cadavid

## 2022-11-08 NOTE — Transfer of Care (Signed)
Immediate Anesthesia Transfer of Care Note  Patient: Betty Price  Procedure(s) Performed: LAPAROSCOPY DIAGNOSTIC (Abdomen)  Patient Location: PACU  Anesthesia Type:General  Level of Consciousness: drowsy and responds to stimulation  Airway & Oxygen Therapy: Patient Spontanous Breathing and Patient connected to face mask oxygen  Post-op Assessment: Report given to RN and Post -op Vital signs reviewed and stable  Post vital signs: Reviewed and stable  Last Vitals:  Vitals Value Taken Time  BP 133/61 11/08/22 1533  Temp    Pulse 102 11/08/22 1535  Resp 20 11/08/22 1535  SpO2 96 % 11/08/22 1535  Vitals shown include unvalidated device data.  Last Pain:  Vitals:   11/08/22 1412  TempSrc: Oral  PainSc: 4          Complications: No notable events documented.

## 2022-11-08 NOTE — Anesthesia Preprocedure Evaluation (Addendum)
Anesthesia Evaluation  Patient identified by MRN, date of birth, ID band Patient awake    Reviewed: Allergy & Precautions, H&P , NPO status , Patient's Chart, lab work & pertinent test results  Airway Mallampati: II  TM Distance: >3 FB Neck ROM: Full    Dental no notable dental hx. (+) Dental Advisory Given   Pulmonary neg pulmonary ROS   Pulmonary exam normal breath sounds clear to auscultation       Cardiovascular Exercise Tolerance: Good negative cardio ROS Normal cardiovascular exam Rhythm:Regular Rate:Normal     Neuro/Psych negative neurological ROS  negative psych ROS   GI/Hepatic negative GI ROS, Neg liver ROS,,,  Endo/Other  negative endocrine ROS    Renal/GU negative Renal ROS  negative genitourinary   Musculoskeletal  (+) Arthritis ,    Abdominal   Peds  Hematology negative hematology ROS (+)   Anesthesia Other Findings   Reproductive/Obstetrics negative OB ROS                             Anesthesia Physical Anesthesia Plan  ASA: 2  Anesthesia Plan: General   Post-op Pain Management: Minimal or no pain anticipated   Induction: Intravenous  PONV Risk Score and Plan: 3 and Ondansetron, Dexamethasone and Treatment may vary due to age or medical condition  Airway Management Planned: Oral ETT  Additional Equipment: None  Intra-op Plan:   Post-operative Plan: Extubation in OR  Informed Consent: I have reviewed the patients History and Physical, chart, labs and discussed the procedure including the risks, benefits and alternatives for the proposed anesthesia with the patient or authorized representative who has indicated his/her understanding and acceptance.     Dental advisory given  Plan Discussed with: CRNA  Anesthesia Plan Comments: (  )       Anesthesia Quick Evaluation

## 2022-11-08 NOTE — Discharge Instructions (Signed)
[facility]  LAPAROSCOPIC SURGERY: POST OP INSTRUCTIONS Always review your discharge instruction sheet given to you by the facility where your surgery was performed. IF YOU HAVE DISABILITY OR FAMILY LEAVE FORMS, YOU MUST BRING THEM TO THE OFFICE FOR PROCESSING.   DO NOT GIVE THEM TO YOUR DOCTOR.  PAIN CONTROL  Pain regimen: take over-the-counter tylenol (acetaminophen) 1000mg  every six hours, the prescription ibuprofen (600mg ) every six hours and the robaxin (methocarbamol) 750mg  every six hours. With all three of these, you should be taking something every two hours. Example: tylenol ( acetaminophen) at 8am, ibuprofen at 10am, robaxin (methocarbamol) at 12pm, tylenol (acetaminophen) again at 2pm, ibuprofen again at 4pm, robaxin (methocarbamol) at 6pm. You also have a prescription for oxycodone, which should be taken if the tylenol (acetaminophen), ibuprofen, and robaxin (methocarbamol) are not enough to control your pain. You may take the oxycodone as frequently as every four hours as needed, but if you are taking the other medications as above, you should not need the oxycodone this frequently. You have also been given a prescription for colace (docusate) which is a stool softener. Please take this as prescribed because the oxycodone can cause constipation and the colace (docusate) will minimize or prevent constipation. Do not drive while taking or under the influence of the oxycodone as it is a narcotic medication. Use ice packs to help control pain. If you need a refill on your pain medication, please contact your pharmacy.  They will contact our office to request authorization. Prescriptions will not be filled after 5pm or on week-ends.  HOME MEDICATIONS Take your usually prescribed medications unless otherwise directed.  DIET You should follow a light diet the first few days after arrival home.  Be sure to include lots of fluids daily.   CONSTIPATION It is common to  experience some constipation after surgery and if you are taking pain medication.  Increasing fluid intake and taking a stool softener (such as Colace) will usually help or prevent this problem from occurring.  A mild laxative (Milk of Magnesia or Miralax) should be taken according to package instructions if there are no bowel movements after 48 hours.  WOUND/INCISION CARE Most patients will experience some swelling and bruising in the area of the incisions.  Ice packs will help.  Swelling and bruising can take several days to resolve.  May shower beginning 11/09/2022.  Do not peel off or scrub skin glue. May allow warm soapy water to run over incision, then rinse and pat dry.  Do not soak in any water (tubs, hot tubs, pools, lakes, oceans) for one week.   ACTIVITIES You may resume regular (light) daily activities beginning the next day--such as daily self-care, walking, climbing stairs--gradually increasing activities as tolerated.  You may have sexual intercourse when it is comfortable.   No lifting greater than 5 pounds for six weeks.  You may drive when you are no longer taking narcotic pain medication, you can comfortably wear a seatbelt, and you can safely maneuver your car and apply brakes.  FOLLOW-UP You should see your doctor in the office for a follow-up appointment approximately 2-3 weeks after your surgery.  You should have been given your post-op/follow-up appointment when your surgery was scheduled.  If you did not receive a post-op/follow-up appointment, make sure that you call for this appointment within a day or two after you arrive home to insure a convenient appointment time.  WHEN TO CALL YOUR DOCTOR: Fever over 101.5 Inability to urinate  Continued bleeding from incision. Increased pain, redness, or drainage from the incision. Increasing abdominal pain  The clinic staff is available to answer your questions during regular business hours.  Please don't hesitate to call and  ask to speak to one of the nurses for clinical concerns.  If you have a medical emergency, go to the nearest emergency room or call 911.  A surgeon from [facility] is always on call at the hospital. [address], [address], [place], [place]  [address] ? [address] [address], [place], [place]   [address] [phone] ? [phone] ? FAX [phone] Web site: [email or web address]

## 2022-11-08 NOTE — Progress Notes (Signed)
Primary RN went over AVS with patient. Pt requested to speak with [facility] before leaving. Primary RN paged [facility] to come see pt and Kris Mouton refused to see pt at this time. Pt had no further questions. Belongings sent home with pt. IV removed.

## 2022-11-08 NOTE — ED Triage Notes (Signed)
Right sided abdominal pain awakened patient some nausea 6/10 sharp in nature

## 2022-11-08 NOTE — ED Provider Notes (Signed)
[facility] EMERGENC[facility] DEPARTMENT AT [facility] Provider Note   CSN: [id number] Arrival date & time: 11/08/22  0204     History  Chief Complaint  Patient presents with   Abdominal Pain    Right sided abdominal pain awakened patient some nausea    MARCELLINE TEMKIN is a 57 y.o. female.  Patient is a 57 year old female with history of hypothyroidism.  Patient presenting today with complaints of right-sided abdominal pain this woke her from sleep approximately 1 hour prior to presentation.  She went to bed feeling well, then woke up with pain to the right upper quadrant and right lateral abdomen that has been constant.  She denies any fevers or chills.  She has felt nauseated but has not vomited.  No urinary or bowel complaints.  No aggravating or alleviating factors.  The history is provided by the patient.       Home Medications Prior to Admission medications   Medication Sig Start Date End Date Taking? Authorizing Provider  progesterone (PROMETRIUM) 100 MG capsule Take 100 mg by mouth daily. 09/22/21  Yes [provider]  MAGNESIUM GLYCINATE PO Take 100 mg by mouth as needed.    [provider]  NP THYROID 30 MG tablet Take 30 mg by mouth every morning. 07/25/19   [provider]  traZODone (DESYREL) 50 MG tablet 1-2 po q h prn sleep Patient taking differently: 50 mg at bedtime as needed. 1-2 po q h prn sleep 07/27/20   Thomasene Lot, DO  Vitamin D, Ergocalciferol, (DRISDOL) 1.25 MG (50000 UNIT) CAPS capsule Take 1 capsule (50,000 Units total) by mouth every 7 (seven) days. 06/22/21   Thomasene Lot, DO      Allergies    Patient has no known allergies.    Review of Systems   Review of Systems  All other systems reviewed and are negative.   Physical Exam Updated Vital Signs BP 134/77 (BP Location: Right Arm)   Pulse 80   Temp 98.7 F (37.1 C) (Oral)   Ht 5\' 4"  (1.626 m)   Wt 77.1 kg   LMP  (LMP Unknown)   SpO2 97%   BMI 29.18 kg/m   Physical Exam Vitals and nursing note reviewed.  Constitutional:      General: She is not in acute distress.    Appearance: She is well-developed. She is not diaphoretic.  HENT:     Head: Normocephalic and atraumatic.  Cardiovascular:     Rate and Rhythm: Normal rate and regular rhythm.     Heart sounds: No murmur heard.    No friction rub. No gallop.  Pulmonary:     Effort: Pulmonary effort is normal. No respiratory distress.     Breath sounds: Normal breath sounds. No wheezing.  Abdominal:     General: Bowel sounds are normal. There is no distension.     Palpations: Abdomen is soft.     Tenderness: There is abdominal tenderness in the right upper quadrant. There is no right CVA tenderness, left CVA tenderness, guarding or rebound.     Comments: There is tenderness to palpation in the right upper quadrant and right lateral abdomen.  Musculoskeletal:        General: Normal range of motion.     Cervical back: Normal range of motion and neck supple.  Skin:    General: Skin is warm and dry.  Neurological:     General: No focal deficit present.     Mental Status: She is alert  and oriented to person, place, and time.     ED Results / Procedures / Treatments   Labs (all labs ordered are listed, but only abnormal results are displayed) Labs Reviewed  CBC  LIPASE, BLOOD  COMPREHENSIVE METABOLIC PANEL  URINALYSIS, ROUTINE W REFLEX MICROSCOPIC    EKG None  Radiology No results found.  Procedures Procedures    Medications Ordered in ED Medications  ketorolac (TORADOL) 30 MG/ML injection 30 mg (has no administration in time range)  morphine (PF) 4 MG/ML injection 4 mg (has no administration in time range)  ondansetron (ZOFRAN) injection 4 mg (has no administration in time range)    ED Course/ Medical Decision Making/ A&P  Patient is a 57 year old female presenting with complaints of sudden onset right-sided abdominal pain.  This started while she was sleeping.   Patient arrives here with stable vital signs and is afebrile.  On exam, she does have tenderness in the right upper quadrant, but no peritoneal signs.  Workup initiated including CBC, metabolic panel, and lipase, all of which are basically unremarkable.  Patient sent for CT scan of the abdomen and pelvis with renal protocol as her presentation was most consistent with renal calculus.  However, it appears as though patient has experienced a cecal bascule based on CT findings.  This was discussed with Dr. Janee Morn from general surgery who has recommended admission for further evaluation.  Patient to be transferred to [facility] for further workup and possible surgical intervention.  Final Clinical Impression(s) / ED Diagnoses Final diagnoses:  None    Rx / DC Orders ED Discharge Orders     None         Geoffery Lyons, MD 11/08/22 [id number]

## 2022-11-08 NOTE — H&P (Addendum)
Betty Price 1965/10/17  [id number].    Requesting MD: Judd Lien, MD Chief Complaint/Reason for Consult: abdominal pain, cecal bascule  HPI:  Betty Price is a 57 y/o F with a PMH hypothyroidism who [facility] this [facility] with abdominal pain. She tells me that she woke up around 0100 because she needed to use the bathroom. When she tried to get out of bed she realized she could not move due to severe right-sided abdominal pain. Pain is non-radiating. She waited 15-20 minutes for the pain to pas, thinking it was gas pain, and then when it did not improve she went to the ED For evaluation. Associated sxs include nausea. She denies similar pain in the past. She denies a history of abdominal surgery. Denise use of blood thinners. Denies changes in bowel habits and reports a long history of constipation. She denies allergies to medications but states she dose not tolerate prednisone. Denies tobacco or drug use. Denies heavy EtOh use. Her husband is with her at the bedside.   ROS: Review of Systems  All other systems reviewed and are negative.   Family History  Problem Relation Age of Onset   Heart disease Father        died of MI   Hypertension Father    Allergic rhinitis Father    Sudden death Father    Diabetes Maternal Grandmother    Allergic rhinitis Mother    Obesity Mother    Colon cancer Neg Hx    Esophageal cancer Neg Hx    Pancreatic cancer Neg Hx    Rectal cancer Neg Hx    Stomach cancer Neg Hx     Past Medical History:  Diagnosis Date   Acute lateral meniscus tear of left knee    Allergy    Back pain    Emotional stress    Food allergy    Gluten free diet    Hives 2012   SAW DR. HICKS   Joint pain     Past Surgical History:  Procedure Laterality Date   GYNECOLOGIC CRYOSURGERY  05/08/2006   cin 1   lasix surgery     MENISCUS REPAIR  10/2009   in 2014 left knee    Social History:  reports that she has never smoked. She has never  used smokeless tobacco. She reports current alcohol use. She reports that she does not use drugs.  Allergies: No Known Allergies  Medications Prior to Admission  Medication Sig Dispense Refill   progesterone (PROMETRIUM) 100 MG capsule Take 100 mg by mouth daily.     MAGNESIUM GLYCINATE PO Take 100 mg by mouth as needed.     NP THYROID 30 MG tablet Take 30 mg by mouth every morning.     traZODone (DESYREL) 50 MG tablet 1-2 po q h prn sleep (Patient taking differently: 50 mg at bedtime as needed. 1-2 po q h prn sleep) 60 tablet 0   Vitamin D, Ergocalciferol, (DRISDOL) 1.25 MG (50000 UNIT) CAPS capsule Take 1 capsule (50,000 Units total) by mouth every 7 (seven) days. 4 capsule 0     Physical Exam: Blood pressure 115/69, pulse 70, temperature (!) 97.5 F (36.4 C), temperature source Oral, resp. rate 18, height 5\' 4"  (1.626 m), weight 77.1 kg, SpO2 100 %. General: Pleasant white female laying on hospital bed, appears stated age, NAD. HEENT: head -normocephalic, atraumatic; Eyes: PERRLA, no conjunctival injection Neck- Trachea is midline CV- RRR, normal S1/S2, no  M/R/G, no lower extremity edema  Pulm- breathing is non-labored ORA Abd- soft, NT/ND, appropriate bowel sounds in 4 quadrants, no masses, hernias, or organomegaly. GU- deferred  MSK- UE/LE symmetrical, no cyanosis, clubbing, or edema. Neuro- CN II-XII grossly in tact, no paresthesias. Psych- Alert and Oriented x3 with appropriate affect Skin: warm and dry, no rashes or lesions   Results for orders placed or performed during the hospital encounter of 11/08/22 (from the past 48 hour(s))  Lipase, blood     Status: None   Collection Time: 11/08/22  2:15 AM  Result Value Ref Range   Lipase 33 11 - 51 U/L    Comment: Performed at [facility], [address], [place], [place] [address]  Comprehensive metabolic panel     Status: Abnormal   Collection Time: 11/08/22  2:15 AM  Result Value Ref Range   Sodium 139  135 - 145 mmol/L   Potassium 4.0 3.5 - 5.1 mmol/L   Chloride 106 98 - 111 mmol/L   CO2 24 22 - 32 mmol/L   Glucose, Bld 109 (H) 70 - 99 mg/dL    Comment: Glucose reference range applies only to samples taken after fasting for at least 8 hours.   BUN 22 (H) 6 - 20 mg/dL   Creatinine, Ser 6.04 0.44 - 1.00 mg/dL   Calcium 9.3 8.9 - 54.0 mg/dL   Total Protein 7.1 6.5 - 8.1 g/dL   Albumin 4.7 3.5 - 5.0 g/dL   AST 20 15 - 41 U/L   ALT 36 0 - 44 U/L   Alkaline Phosphatase 44 38 - 126 U/L   Total Bilirubin 0.5 0.3 - 1.2 mg/dL   GFR, Estimated >98 >11 mL/min    Comment: (NOTE) Calculated using the CKD-EPI Creatinine Equation (2021)    Anion gap 9 5 - 15    Comment: Performed at [facility], [address] [place], [place] [address]  CBC     Status: None   Collection Time: 11/08/22  2:15 AM  Result Value Ref Range   WBC 7.5 4.0 - 10.5 K/uL   RBC 4.35 3.87 - 5.11 MIL/uL   Hemoglobin 14.0 12.0 - 15.0 g/dL   HCT 29.5 62.1 - 30.8 %   MCV 90.1 80.0 - 100.0 fL   MCH 32.2 26.0 - 34.0 pg   MCHC 35.7 30.0 - 36.0 g/dL   RDW 65.7 84.6 - 96.2 %   Platelets 250 150 - 400 K/uL   nRBC 0.0 0.0 - 0.2 %    Comment: Performed at [facility], [address], [place], [place] [address]  Glucose, capillary     Status: None   Collection Time: 11/08/22  7:29 AM  Result Value Ref Range   Glucose-Capillary 85 70 - 99 mg/dL    Comment: Glucose reference range applies only to samples taken after fasting for at least 8 hours.   CT Renal Stone Study  Result Date: 11/08/2022 CLINICAL DATA:  Abdominal and flank pain EXAM: CT ABDOMEN AND PELVIS WITHOUT CONTRAST TECHNIQUE: Multidetector CT imaging of the abdomen and pelvis was performed following the standard protocol without IV contrast. RADIATION DOSE REDUCTION: This exam was performed according to the departmental dose-optimization program which includes automated exposure control, adjustment of the mA and/or kV according  to patient size and/or use of iterative reconstruction technique. COMPARISON:  None Available. FINDINGS: Lower chest: No acute abnormality. Hepatobiliary: No focal liver abnormality is seen. No gallstones, gallbladder wall thickening, or biliary dilatation. Pancreas:  Unremarkable. No pancreatic ductal dilatation or surrounding inflammatory changes. Spleen: Normal in size without focal abnormality. Adrenals/Urinary Tract: Adrenal glands are unremarkable. Kidneys are normal, without renal calculi, focal lesion, or hydronephrosis. Bladder is unremarkable. Stomach/Bowel: Stomach is within normal limits. Appendix is not visualized. The cecum is high riding and located in the left upper abdomen. The cecum is distended and contains an air-fluid level. No evidence of bowel wall thickening, distention, or inflammatory changes. Vascular/Lymphatic: Aortic atherosclerosis. No enlarged abdominal or pelvic lymph nodes. Reproductive: Uterus and bilateral adnexa are unremarkable. Other: No abdominal wall hernia or abnormality. No abdominopelvic ascites. Musculoskeletal: No acute or significant osseous findings. IMPRESSION: 1. No evidence of nephrolithiasis or hydronephrosis. 2. The cecum is high riding and located in the left upper abdomen. The cecum is distended and contains an air-fluid level, which may be seen in the setting of cecal bascule. Consider surgical consultation. Aortic Atherosclerosis (ICD10-I70.0). These results were called by telephone at the time of interpretation on 11/08/2022 at 3:11 am to provider Geoffery Lyons , who verbally acknowledged these results. Electronically Signed   By: Darliss Cheney M.D.   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 I recommend diagnostic laparoscopy,  likely cecectomy vs laparoscopic right colectomy.    Risks of surgery including bleeding, infection, damage to surrounding structures, conversion to open, drain placement, need for additional procedures, prolonged hospital stay, as well as the risks of general anesthesia were discussed with the patient and she would like to proceed with surgery. Questions were welcomed and answered.    FEN - NPO, IVF VTE - SCD's ID - cefotetan on call to OR. Admit - CCS service  High MDM    I reviewed nursing notes, ED provider notes, last 24 h vitals and pain scores, last 48 h intake and output, last 24 h labs and trends, and last 24 h imaging results.  Adam Phenix, [facility] Surgery 11/08/2022, 8:20 AM Please see Amion for pager number during day hours 7:00am-4:30pm or 7:00am -11:30am on weekends

## 2022-11-09 ENCOUNTER — Encounter (HOSPITAL_COMMUNITY): Payer: Self-pay | Admitting: Surgery

## 2022-11-09 NOTE — Anesthesia Postprocedure Evaluation (Signed)
Anesthesia Post Note  Patient: Betty Price  Procedure(s) Performed: LAPAROSCOPY DIAGNOSTIC (Abdomen)     Patient location during evaluation: PACU Anesthesia Type: General Level of consciousness: sedated and patient cooperative Pain management: pain level controlled Vital Signs Assessment: post-procedure vital signs reviewed and stable Respiratory status: spontaneous breathing Cardiovascular status: stable Anesthetic complications: no   No notable events documented.  Last Vitals:  Vitals:   11/08/22 1600 11/08/22 1619  BP: 120/61 122/86  Pulse: 77 77  Resp: 17   Temp: 36.5 C   SpO2: 93% 98%    Last Pain:  Vitals:   11/08/22 1600  TempSrc:   PainSc: Asleep                 Lewie Loron

## 2022-11-21 NOTE — Discharge Summary (Signed)
[facility] Discharge Summary   Patient ID: ANZLEY DIBBERN MRN: [id number] DOB/AGE: December 02, 1965 57 y.o.  Admit date: 11/08/2022 Discharge date: 11/08/2022  Admitting Diagnosis: Cecal bascule  Discharge Diagnosis Abdominal pain  Consultants None   Imaging: No results found.  Procedures Dr. Luisa Hart 11/08/22 diagnostic laparoscopy   HPI:  Betty Price is a 57 y/o F with a PMH hypothyroidism who [facility] this morning with abdominal pain. She tells me that she woke up around 0100 because she needed to use the bathroom. When she tried to get out of bed she realized she could not move due to severe right-sided abdominal pain. Pain is non-radiating. She waited 15-20 minutes for the pain to pas, thinking it was gas pain, and then when it did not improve she went to the ED For evaluation. Associated sxs include nausea. She denies similar pain in the past. She denies a history of abdominal surgery. Denise use of blood thinners. Denies changes in bowel habits and reports a long history of constipation. She denies allergies to medications but states she dose not tolerate prednisone. Denies tobacco or drug use. Denies heavy EtOh use. Her husband is with her at the bedside.   Hospital Course:  CT scan suggested cecal bascule with cecum in LUQ and air-fluid levels. She was taken for the above operation where no cecal bascule was identified - she had normal cecum and grossly normal colon/appendix. She was admitted post-operatively for observation and diet advancement. On POD#0 she was stable for discharge -normal vitals, pain controlled, tolerating PO. Follow up as below.   Physical Exam: I was not present at the time of discharge. Therefore some of the above information was obtained entirely from chart review.  Allergies as of 11/08/2022   No Known Allergies      Medication List     TAKE these medications    acetaminophen 500 MG tablet Commonly known  as: TYLENOL Take 2 tablets (1,000 mg total) by mouth 4 (four) times daily.   BIEST/PROGESTERONE TD Place 1.25 mg under the tongue in the morning and at bedtime.   cetirizine 10 MG tablet Commonly known as: ZYRTEC Take 10 mg by mouth daily as needed for allergies.   ibuprofen 600 MG tablet Commonly known as: ADVIL Take 1 tablet (600 mg total) by mouth 4 (four) times daily.   MAGNESIUM GLYCINATE PO Take 100 mg by mouth daily.   methocarbamol 750 MG tablet Commonly known as: Robaxin-750 Take 1 tablet (750 mg total) by mouth 4 (four) times daily.   montelukast 10 MG tablet Commonly known as: SINGULAIR Take 10 mg by mouth daily as needed (allergies).   NP Thyroid 30 MG tablet Generic drug: thyroid Take 30 mg by mouth every morning.   oxyCODONE 5 MG immediate release tablet Commonly known as: Roxicodone Take 1 tablet (5 mg total) by mouth every 4 (four) hours as needed for severe pain.   progesterone 100 MG capsule Commonly known as: PROMETRIUM Take 100 mg by mouth daily.   Semaglutide-Weight Management 0.25 MG/0.5ML Soaj Inject 0.6 mg into the skin once a week.   traZODone 50 MG tablet Commonly known as: DESYREL 1-2 po q h prn sleep   Vitamin D (Ergocalciferol) 1.25 MG (50000 UNIT) Caps capsule Commonly known as: DRISDOL Take 1 capsule (50,000 Units total) by mouth every 7 (seven) days.          Follow-up Information     [facility] Follow up  in 2 week(s).   Specialty: General Surgery Why: For wound re-check Contact information: [address] [place] [address] [phone]                Signed: Hosie Price, [facility] 11/21/2022, 2:54 PM

## 2024-04-15 ENCOUNTER — Other Ambulatory Visit: Payer: Self-pay | Admitting: Family Medicine

## 2024-04-15 DIAGNOSIS — Z1231 Encounter for screening mammogram for malignant neoplasm of breast: Secondary | ICD-10-CM

## 2024-05-13 ENCOUNTER — Ambulatory Visit
Admission: RE | Admit: 2024-05-13 | Discharge: 2024-05-13 | Disposition: A | Source: Ambulatory Visit | Attending: Family Medicine | Admitting: Family Medicine

## 2024-05-13 DIAGNOSIS — Z1231 Encounter for screening mammogram for malignant neoplasm of breast: Secondary | ICD-10-CM
